# Patient Record
Sex: Female | Born: 1990 | Hispanic: Yes | Marital: Married | Smoking: Never smoker
Health system: Southern US, Community
[De-identification: ages and names within clinical notes are randomized; demographics above are authoritative.]

## PROBLEM LIST (undated history)

## (undated) DIAGNOSIS — Z789 Other specified health status: Secondary | ICD-10-CM

## (undated) HISTORY — PX: NO PAST SURGERIES: SHX2092

## (undated) HISTORY — DX: Other specified health status: Z78.9

---

## 2017-09-23 NOTE — L&D Delivery Note (Signed)
  Maud, Rubendall [[id number]]  Delivery Note At 10:48 PM a viable female was delivered via Vaginal, Spontaneous (Presentation: vertex ; LOA ).  APGAR:8, 9 ; weight 4lb 12.5 oz .   Placenta status: spontaneous ,intact .  Cord: 3 vessel  with the following complications: none   Anesthesia:  Epidural Episiotomy:  no Lacerations:  none Suture Repair: n/a Est. Blood Loss (mL):   Mom to postpartum.  Baby to Couplet care / Skin to Skin.  Felisa Bonier, MD, PGY-1 Family Medicine- [facility] [place] 02/13/2018, 11:06 PM     Janny, Crute [[id number]]  Delivery Note At 10:52 PM a viable female was delivered via Vaginal, Spontaneous (Presentation: vertex; ROA ).  APGAR: 7, 9; weight 4lb 3.2oz  .   Placenta status:spontanoues , intact . Cord: 3 vessel with the following complications: none  Anesthesia:  Epidural Episiotomy:  no Lacerations:  none Suture Repair: n/a Est. Blood Loss (mL):   Mom to postpartum.  Baby to Couplet care / Skin to Skin.  Derenda Fennel, PGY-1 Family Medicine- [facility] [place] 02/13/2018, 11:06 PM  Anaiya Wisinski is a 27 y.o. female G3P2002 with IUP at [redacted]w[redacted]d admitted for SOL .  She progressed without augmentation to complete and pushed less than 10 minutes to deliver baby A.  Baby B's position was confirmed vertex by [place] after delivery of baby A. Patient pushed x1 ctx to deliver baby B. Cord clamped by MD and cut by FOB.  Placenta intact and spontaneous, bleeding minimal.  No lacerations.   Mom stable prior to transfer to postpartum. Due to baby B's size, neonatologist to evaluate if baby will stay in couplet care vs NICU. Mother plans on breast and bottle feeding. She requests Nexplanon for birth control.

## 2017-11-17 LAB — OB RESULTS CONSOLE HIV ANTIBODY (ROUTINE TESTING): HIV: NONREACTIVE

## 2017-11-17 LAB — OB RESULTS CONSOLE RPR: RPR: NONREACTIVE

## 2017-11-17 LAB — OB RESULTS CONSOLE RUBELLA ANTIBODY, IGM: Rubella: IMMUNE

## 2017-11-17 LAB — OB RESULTS CONSOLE HGB/HCT, BLOOD
HCT: 37
HEMOGLOBIN: 12.1

## 2017-11-17 LAB — OB RESULTS CONSOLE GC/CHLAMYDIA
CHLAMYDIA, DNA PROBE: NEGATIVE
Gonorrhea: NEGATIVE

## 2017-11-17 LAB — CYSTIC FIBROSIS DIAGNOSTIC STUDY: Interpretation-CFDNA:: NEGATIVE

## 2017-11-17 LAB — CULTURE, OB URINE: Urine Culture, OB: NEGATIVE

## 2017-11-17 LAB — OB RESULTS CONSOLE PLATELET COUNT: PLATELETS: 325

## 2017-11-17 LAB — OB RESULTS CONSOLE ABO/RH: RH Type: POSITIVE

## 2017-11-17 LAB — OB RESULTS CONSOLE VARICELLA ZOSTER ANTIBODY, IGG: VARICELLA IGG: NON-IMMUNE/NOT IMMUNE

## 2017-11-17 LAB — OB RESULTS CONSOLE ANTIBODY SCREEN: ANTIBODY SCREEN: NEGATIVE

## 2017-11-17 LAB — CYTOLOGY - PAP: PAP SMEAR: NEGATIVE

## 2017-11-17 LAB — OB RESULTS CONSOLE HEPATITIS B SURFACE ANTIGEN: Hepatitis B Surface Ag: NEGATIVE

## 2017-11-17 LAB — GLUCOSE, 1 HOUR: Glucose 1 Hour: 74

## 2017-12-05 ENCOUNTER — Encounter: Payer: Self-pay | Admitting: *Deleted

## 2017-12-08 ENCOUNTER — Encounter: Payer: Self-pay | Admitting: Obstetrics and Gynecology

## 2017-12-08 ENCOUNTER — Ambulatory Visit (INDEPENDENT_AMBULATORY_CARE_PROVIDER_SITE_OTHER): Payer: Self-pay | Admitting: Obstetrics and Gynecology

## 2017-12-08 VITALS — BP 103/65 | HR 72 | Wt 160.8 lb

## 2017-12-08 DIAGNOSIS — O0990 Supervision of high risk pregnancy, unspecified, unspecified trimester: Secondary | ICD-10-CM

## 2017-12-08 DIAGNOSIS — Z603 Acculturation difficulty: Secondary | ICD-10-CM | POA: Insufficient documentation

## 2017-12-08 DIAGNOSIS — O30032 Twin pregnancy, monochorionic/diamniotic, second trimester: Secondary | ICD-10-CM

## 2017-12-08 DIAGNOSIS — O0992 Supervision of high risk pregnancy, unspecified, second trimester: Secondary | ICD-10-CM

## 2017-12-08 DIAGNOSIS — O30039 Twin pregnancy, monochorionic/diamniotic, unspecified trimester: Secondary | ICD-10-CM | POA: Insufficient documentation

## 2017-12-08 DIAGNOSIS — Z789 Other specified health status: Secondary | ICD-10-CM

## 2017-12-08 NOTE — Progress Notes (Signed)
   PRENATAL VISIT NOTE - [facility]  Subjective:  Angel Swanson is a 27 y.o. G3P2002 at 2864w6d being seen today for ongoing prenatal care.  She is currently monitored for the following issues for this high-risk pregnancy and has Supervision of high risk pregnancy, antepartum; Monochorionic diamniotic twin gestation; and Language barrier on their problem list.  Patient reports no complaints.  Contractions: Not present. Vag. Bleeding: None.  Movement: Present. Denies leaking of fluid.   The following portions of the patient's history were reviewed and updated as appropriate: allergies, current medications, past family history, past medical history, past social history, past surgical history and problem list. Problem list updated.  Objective:   Vitals:   12/08/17 1545  BP: 103/65  Pulse: 72  Weight: 160 lb 12.8 oz (72.9 kg)   Fetal Status: Fetal Heart Rate (bpm): 151/146   Movement: Present     General:  Alert, oriented and cooperative. Patient is in no acute distress.  Skin: Skin is warm and dry. No rash noted.   Cardiovascular: Normal heart rate noted  Respiratory: Normal respiratory effort, no problems with respiration noted  Abdomen: Soft, gravid, appropriate for gestational age.  Pain/Pressure: Absent     Pelvic: Cervical exam deferred        Extremities: Normal range of motion.  Edema: None  Mental Status:  Normal mood and affect. Normal behavior. Normal judgment and thought content.   Assessment and Plan:  Pregnancy: G3P2002 at 5764w6d  1. Supervision of high risk pregnancy, antepartum No issues currently  2. Monochorionic diamniotic twin gestation in second trimester Reviewed mono-di twins and need for increased surveillance, patient verbalizes understanding - US MFM OB DETAIL +14 WK; Future - US MFM OB DETAIL ADDL GEST +14 WK; Future  3. Language barrier Spanish speaking    Preterm labor symptoms and general obstetric precautions including but  not limited to vaginal bleeding, contractions, leaking of fluid and fetal movement were reviewed in detail with the patient. Please refer to After Visit Summary for other counseling recommendations.  Return in about 2 weeks (around 12/22/2017) for OB visit (MD), 2 hr GTT, 3rd trim labs.   Conan BowensKelly M Dahlton Hinde, MD

## 2017-12-12 ENCOUNTER — Encounter (HOSPITAL_COMMUNITY): Payer: Self-pay

## 2017-12-12 ENCOUNTER — Ambulatory Visit (HOSPITAL_COMMUNITY)
Admission: RE | Admit: 2017-12-12 | Discharge: 2017-12-12 | Disposition: A | Discharge status: Home / Self Care | Payer: Self-pay | Source: Ambulatory Visit | Attending: Obstetrics and Gynecology | Admitting: Obstetrics and Gynecology

## 2017-12-12 DIAGNOSIS — Z3A26 26 weeks gestation of pregnancy: Secondary | ICD-10-CM | POA: Insufficient documentation

## 2017-12-12 DIAGNOSIS — O30032 Twin pregnancy, monochorionic/diamniotic, second trimester: Secondary | ICD-10-CM | POA: Insufficient documentation

## 2017-12-12 DIAGNOSIS — Z363 Encounter for antenatal screening for malformations: Secondary | ICD-10-CM | POA: Insufficient documentation

## 2017-12-12 DIAGNOSIS — Z789 Other specified health status: Secondary | ICD-10-CM

## 2017-12-15 ENCOUNTER — Other Ambulatory Visit (HOSPITAL_COMMUNITY): Payer: Self-pay | Admitting: *Deleted

## 2017-12-15 DIAGNOSIS — O30033 Twin pregnancy, monochorionic/diamniotic, third trimester: Secondary | ICD-10-CM

## 2017-12-26 ENCOUNTER — Ambulatory Visit (HOSPITAL_COMMUNITY)
Admission: RE | Admit: 2017-12-26 | Discharge: 2017-12-26 | Disposition: A | Discharge status: Home / Self Care | Payer: Self-pay | Source: Ambulatory Visit | Attending: Obstetrics and Gynecology | Admitting: Obstetrics and Gynecology

## 2017-12-26 ENCOUNTER — Encounter (HOSPITAL_COMMUNITY): Payer: Self-pay | Admitting: Obstetrics and Gynecology

## 2017-12-26 ENCOUNTER — Other Ambulatory Visit: Payer: Self-pay

## 2017-12-26 ENCOUNTER — Ambulatory Visit (INDEPENDENT_AMBULATORY_CARE_PROVIDER_SITE_OTHER): Payer: Self-pay | Admitting: Obstetrics and Gynecology

## 2017-12-26 ENCOUNTER — Other Ambulatory Visit (HOSPITAL_COMMUNITY): Payer: Self-pay | Admitting: Obstetrics and Gynecology

## 2017-12-26 ENCOUNTER — Ambulatory Visit (HOSPITAL_COMMUNITY): Admission: RE | Admit: 2017-12-26 | Payer: Self-pay | Source: Ambulatory Visit

## 2017-12-26 ENCOUNTER — Encounter: Payer: Self-pay | Admitting: Obstetrics and Gynecology

## 2017-12-26 ENCOUNTER — Encounter (HOSPITAL_COMMUNITY): Payer: Self-pay

## 2017-12-26 VITALS — BP 106/71 | HR 96 | Wt 162.3 lb

## 2017-12-26 DIAGNOSIS — Z362 Encounter for other antenatal screening follow-up: Secondary | ICD-10-CM

## 2017-12-26 DIAGNOSIS — Z3A28 28 weeks gestation of pregnancy: Secondary | ICD-10-CM

## 2017-12-26 DIAGNOSIS — O30032 Twin pregnancy, monochorionic/diamniotic, second trimester: Secondary | ICD-10-CM | POA: Insufficient documentation

## 2017-12-26 DIAGNOSIS — O30033 Twin pregnancy, monochorionic/diamniotic, third trimester: Secondary | ICD-10-CM

## 2017-12-26 DIAGNOSIS — O0993 Supervision of high risk pregnancy, unspecified, third trimester: Secondary | ICD-10-CM

## 2017-12-26 DIAGNOSIS — Z3A29 29 weeks gestation of pregnancy: Secondary | ICD-10-CM | POA: Insufficient documentation

## 2017-12-26 DIAGNOSIS — O0990 Supervision of high risk pregnancy, unspecified, unspecified trimester: Secondary | ICD-10-CM

## 2017-12-26 DIAGNOSIS — Z23 Encounter for immunization: Secondary | ICD-10-CM

## 2017-12-26 NOTE — Progress Notes (Signed)
.     PRENATAL VISIT NOTE  Subjective:  Angel Swanson is a [age] y.o. G3P2002 at 2833w6d being seen today for ongoing prenatal care.  She is currently monitored for the following issues for this high-risk pregnancy and has Supervision of high risk pregnancy, antepartum; Monochorionic diamniotic twin gestation; and Language barrier on their problem list.  Patient reports no complaints.  Contractions: Not present. Vag. Bleeding: None.  Movement: Present. Denies leaking of fluid.   The following portions of the patient's history were reviewed and updated as appropriate: allergies, current medications, past family history, past medical history, past social history, past surgical history and problem list. Problem list updated.  Objective:   Vitals:   12/26/17 0956  BP: 106/71  Pulse: 96  Weight: 162 lb 4.8 oz (73.6 kg)    Fetal Status: Fetal Heart Rate (bpm): 138/150   Movement: Present     General:  Alert, oriented and cooperative. Patient is in no acute distress.  Skin: Skin is warm and dry. No rash noted.   Cardiovascular: Normal heart rate noted  Respiratory: Normal respiratory effort, no problems with respiration noted  Abdomen: Soft, gravid, appropriate for gestational age.  Pain/Pressure: Absent     Pelvic: Cervical exam deferred        Extremities: Normal range of motion.  Edema: None  Mental Status: Normal mood and affect. Normal behavior. Normal judgment and thought content.   Assessment and Plan:  Pregnancy: G3P2002 at 2833w6d  1. Supervision of high risk pregnancy, antepartum Patient is doing well without complaints Third trimester labs today with tdap Patient remains undecided on contraception and pediatrician - Tdap vaccine greater than or equal to 7yo IM  2. Monochorionic diamniotic twin gestation in third trimester Patient has scheduled follow up ultrasound  today  Preterm labor symptoms and general obstetric precautions including but not limited to vaginal  bleeding, contractions, leaking of fluid and fetal movement were reviewed in detail with the patient. Please refer to After Visit Summary for other counseling recommendations.  No follow-ups on file.  Future Appointments  Date Time Provider Department Center  01/09/2018  9:30 AM [facility] US 1 [facility]    Catalina AntiguaPeggy Uliana Brinker, MD

## 2017-12-27 LAB — CBC
Hematocrit: 35.5 % (ref 34.0–46.6)
Hemoglobin: 12.3 g/dL (ref 11.1–15.9)
MCH: 29.1 pg (ref 26.6–33.0)
MCHC: 34.6 g/dL (ref 31.5–35.7)
MCV: 84 fL (ref 79–97)
Platelets: 307 10*3/uL (ref 150–379)
RBC: 4.22 x10E6/uL (ref 3.77–5.28)
RDW: 13 % (ref 12.3–15.4)
WBC: 10.4 10*3/uL (ref 3.4–10.8)

## 2017-12-27 LAB — GLUCOSE TOLERANCE, 2 HOURS W/ 1HR
GLUCOSE, 1 HOUR: 97 mg/dL (ref 65–179)
GLUCOSE, 2 HOUR: 104 mg/dL (ref 65–152)
GLUCOSE, FASTING: 76 mg/dL (ref 65–91)

## 2017-12-27 LAB — RPR: RPR Ser Ql: NONREACTIVE

## 2017-12-27 LAB — HIV ANTIBODY (ROUTINE TESTING W REFLEX): HIV Screen 4th Generation wRfx: NONREACTIVE

## 2018-01-09 ENCOUNTER — Ambulatory Visit (HOSPITAL_COMMUNITY)
Admission: RE | Admit: 2018-01-09 | Discharge: 2018-01-09 | Disposition: A | Discharge status: Home / Self Care | Payer: Self-pay | Source: Ambulatory Visit | Attending: Obstetrics and Gynecology | Admitting: Obstetrics and Gynecology

## 2018-01-09 ENCOUNTER — Encounter (HOSPITAL_COMMUNITY): Payer: Self-pay

## 2018-01-09 ENCOUNTER — Other Ambulatory Visit (HOSPITAL_COMMUNITY): Payer: Self-pay | Admitting: *Deleted

## 2018-01-09 DIAGNOSIS — Z362 Encounter for other antenatal screening follow-up: Secondary | ICD-10-CM | POA: Insufficient documentation

## 2018-01-09 DIAGNOSIS — O30033 Twin pregnancy, monochorionic/diamniotic, third trimester: Secondary | ICD-10-CM

## 2018-01-09 DIAGNOSIS — O30032 Twin pregnancy, monochorionic/diamniotic, second trimester: Secondary | ICD-10-CM | POA: Insufficient documentation

## 2018-01-09 DIAGNOSIS — Z3A31 31 weeks gestation of pregnancy: Secondary | ICD-10-CM | POA: Insufficient documentation

## 2018-01-09 NOTE — ED Notes (Signed)
Interpreter [phone] used for visit.

## 2018-01-13 ENCOUNTER — Ambulatory Visit (HOSPITAL_COMMUNITY)
Admission: RE | Admit: 2018-01-13 | Discharge: 2018-01-13 | Disposition: A | Discharge status: Home / Self Care | Payer: Self-pay | Source: Ambulatory Visit | Attending: Obstetrics and Gynecology | Admitting: Obstetrics and Gynecology

## 2018-01-13 ENCOUNTER — Encounter (HOSPITAL_COMMUNITY): Payer: Self-pay

## 2018-01-13 DIAGNOSIS — O30033 Twin pregnancy, monochorionic/diamniotic, third trimester: Secondary | ICD-10-CM | POA: Insufficient documentation

## 2018-01-13 DIAGNOSIS — Z3A32 32 weeks gestation of pregnancy: Secondary | ICD-10-CM | POA: Insufficient documentation

## 2018-01-20 ENCOUNTER — Other Ambulatory Visit (HOSPITAL_COMMUNITY): Payer: Self-pay | Admitting: Obstetrics and Gynecology

## 2018-01-20 ENCOUNTER — Ambulatory Visit (HOSPITAL_COMMUNITY)
Admission: RE | Admit: 2018-01-20 | Discharge: 2018-01-20 | Disposition: A | Discharge status: Home / Self Care | Payer: Self-pay | Source: Ambulatory Visit | Attending: Obstetrics and Gynecology | Admitting: Obstetrics and Gynecology

## 2018-01-20 ENCOUNTER — Encounter (HOSPITAL_COMMUNITY): Payer: Self-pay

## 2018-01-20 DIAGNOSIS — O30033 Twin pregnancy, monochorionic/diamniotic, third trimester: Secondary | ICD-10-CM

## 2018-01-20 DIAGNOSIS — Z3A33 33 weeks gestation of pregnancy: Secondary | ICD-10-CM

## 2018-01-20 DIAGNOSIS — O30032 Twin pregnancy, monochorionic/diamniotic, second trimester: Secondary | ICD-10-CM | POA: Insufficient documentation

## 2018-01-20 NOTE — Addendum Note (Signed)
Encounter addended by: Lenise Arena, RDMS on: 01/20/2018 9:54 AM  Actions taken: Imaging Exam ended

## 2018-01-27 ENCOUNTER — Ambulatory Visit (HOSPITAL_COMMUNITY)
Admission: RE | Admit: 2018-01-27 | Discharge: 2018-01-27 | Disposition: A | Discharge status: Home / Self Care | Payer: Self-pay | Source: Ambulatory Visit | Attending: Obstetrics and Gynecology | Admitting: Obstetrics and Gynecology

## 2018-02-03 ENCOUNTER — Ambulatory Visit (HOSPITAL_COMMUNITY): Payer: Self-pay

## 2018-02-04 ENCOUNTER — Encounter (HOSPITAL_COMMUNITY): Payer: Self-pay

## 2018-02-04 ENCOUNTER — Other Ambulatory Visit (HOSPITAL_COMMUNITY): Payer: Self-pay | Admitting: Obstetrics and Gynecology

## 2018-02-04 ENCOUNTER — Ambulatory Visit (HOSPITAL_COMMUNITY)
Admission: RE | Admit: 2018-02-04 | Discharge: 2018-02-04 | Disposition: A | Discharge status: Home / Self Care | Payer: Self-pay | Source: Ambulatory Visit | Attending: Obstetrics and Gynecology | Admitting: Obstetrics and Gynecology

## 2018-02-04 ENCOUNTER — Inpatient Hospital Stay (HOSPITAL_COMMUNITY)
Admission: AD | Admit: 2018-02-04 | Discharge: 2018-02-04 | Disposition: A | Discharge status: Home / Self Care | Payer: Self-pay | Source: Ambulatory Visit | Attending: Obstetrics and Gynecology | Admitting: Obstetrics and Gynecology

## 2018-02-04 ENCOUNTER — Encounter (HOSPITAL_COMMUNITY): Payer: Self-pay | Admitting: *Deleted

## 2018-02-04 DIAGNOSIS — Z3A35 35 weeks gestation of pregnancy: Secondary | ICD-10-CM | POA: Insufficient documentation

## 2018-02-04 DIAGNOSIS — Z789 Other specified health status: Secondary | ICD-10-CM

## 2018-02-04 DIAGNOSIS — Z362 Encounter for other antenatal screening follow-up: Secondary | ICD-10-CM

## 2018-02-04 DIAGNOSIS — O30033 Twin pregnancy, monochorionic/diamniotic, third trimester: Secondary | ICD-10-CM | POA: Insufficient documentation

## 2018-02-04 DIAGNOSIS — O365932 Maternal care for other known or suspected poor fetal growth, third trimester, fetus 2: Secondary | ICD-10-CM

## 2018-02-04 DIAGNOSIS — Z79899 Other long term (current) drug therapy: Secondary | ICD-10-CM | POA: Insufficient documentation

## 2018-02-04 DIAGNOSIS — O368330 Maternal care for abnormalities of the fetal heart rate or rhythm, third trimester, not applicable or unspecified: Secondary | ICD-10-CM

## 2018-02-04 DIAGNOSIS — Z3689 Encounter for other specified antenatal screening: Secondary | ICD-10-CM

## 2018-02-04 DIAGNOSIS — O365990 Maternal care for other known or suspected poor fetal growth, unspecified trimester, not applicable or unspecified: Secondary | ICD-10-CM | POA: Insufficient documentation

## 2018-02-04 NOTE — MAU Provider Note (Signed)
  History     CSN: [id number]  Arrival date and time: 02/04/18 1626   First Provider Initiated Contact with Patient 02/04/18 1754      Chief Complaint  Patient presents with  . BPP 6/8   Hennessy Bartel is a 27 y.o. G3P2002 at [redacted]w[redacted]d who presents today from MFM for fetal monitoring. Baby B had 6/8 BPP and baby A had 8/8 BPP. MFM wanted FHR monitoring. Patient denies any VB or LOF. She reports normal fetal movement.   Past Medical History:  Diagnosis Date  . Medical history non-contributory     Past Surgical History:  Procedure Laterality Date  . NO PAST SURGERIES      History reviewed. No pertinent family history.  Social History   Tobacco Use  . Smoking status: Never Smoker  . Smokeless tobacco: Never Used  Substance Use Topics  . Alcohol use: No    Frequency: Never  . Drug use: No    Allergies: No Known Allergies  Medications Prior to Admission  Medication Sig Dispense Refill Last Dose  . prenatal vitamin w/FE, FA (PRENATAL 1 + 1) 27-1 MG TABS tablet Take 1 tablet by mouth daily at 12 noon.   02/03/2018 at Unknown time    Review of Systems  Constitutional: Negative for chills and fever.  Gastrointestinal: Negative for nausea and vomiting.  Genitourinary: Negative for dysuria, vaginal bleeding and vaginal discharge.   Physical Exam   Blood pressure 115/67, pulse 66, last menstrual period 05/31/2017, SpO2 100 %.  Physical Exam  Nursing note and vitals reviewed. Constitutional: She is oriented to person, place, and time. She appears well-developed and well-nourished. No distress.  HENT:  Head: Normocephalic.  Cardiovascular: Normal rate.  Respiratory: Effort normal.  GI: Soft. There is no tenderness.  Neurological: She is alert and oriented to person, place, and time.  Skin: Skin is warm and dry.  Psychiatric: She has a normal mood and affect.    MAU Course  Procedures  MDM 1755: DW Dr. Emelda Fear and reviewed FHR tracing. Baby B now 8/10 BPP  with reactive NST and baby A 10/10 with reactive NST. OK for DC home.   Assessment and Plan   1. Monochorionic diamniotic twin gestation in third trimester   2. Language barrier   3. [redacted] weeks gestation of pregnancy   4. NST (non-stress test) reactive    DC home Comfort measures reviewed  3rd Trimester precautions  PTL precautions  Fetal kick counts RX: none  Return to MAU as needed FU with OB as planned  Follow-up Information    Center for [facility] Follow up.   Specialty:  Obstetrics and Gynecology Contact information: [address] [place] [address] [phone]           Thressa Sheller 02/04/2018, 5:54 PM

## 2018-02-04 NOTE — MAU Note (Signed)
Sent over from MFM, twin gestation, BPP on baby B was 6/8- sent for prolonged monitoring.  Pt verbalized understanding.

## 2018-02-04 NOTE — Discharge Instructions (Signed)

## 2018-02-04 NOTE — ED Notes (Signed)
Dr. Ezzard Standing requests that patient go to MAU for further EFM due to Baby B 6/8 on BPP (- breathing movements). Bonnye Fava present as interpreter during discussion with patient. Report called to J. Spurlock-Frizzell, RN, CN and H. Mathews Robinsons, CNM. Patient escorted to MAU and assisted with sign-in.

## 2018-02-04 NOTE — Addendum Note (Signed)
Encounter addended by: Arvil Persons, RN on: 02/04/2018 4:33 PM  Actions taken: Sign clinical note

## 2018-02-10 ENCOUNTER — Other Ambulatory Visit: Payer: Self-pay

## 2018-02-10 ENCOUNTER — Other Ambulatory Visit (HOSPITAL_COMMUNITY): Payer: Self-pay | Admitting: Obstetrics and Gynecology

## 2018-02-10 ENCOUNTER — Encounter (HOSPITAL_COMMUNITY): Payer: Self-pay

## 2018-02-10 ENCOUNTER — Ambulatory Visit (HOSPITAL_COMMUNITY)
Admission: RE | Admit: 2018-02-10 | Discharge: 2018-02-10 | Disposition: A | Discharge status: Home / Self Care | Payer: Self-pay | Source: Ambulatory Visit | Attending: Obstetrics and Gynecology | Admitting: Obstetrics and Gynecology

## 2018-02-10 ENCOUNTER — Ambulatory Visit (INDEPENDENT_AMBULATORY_CARE_PROVIDER_SITE_OTHER): Payer: Self-pay | Admitting: Obstetrics & Gynecology

## 2018-02-10 VITALS — BP 136/78 | HR 67 | Wt 168.0 lb

## 2018-02-10 DIAGNOSIS — O365932 Maternal care for other known or suspected poor fetal growth, third trimester, fetus 2: Secondary | ICD-10-CM | POA: Insufficient documentation

## 2018-02-10 DIAGNOSIS — Z3A36 36 weeks gestation of pregnancy: Secondary | ICD-10-CM | POA: Insufficient documentation

## 2018-02-10 DIAGNOSIS — Z113 Encounter for screening for infections with a predominantly sexual mode of transmission: Secondary | ICD-10-CM

## 2018-02-10 DIAGNOSIS — O30033 Twin pregnancy, monochorionic/diamniotic, third trimester: Secondary | ICD-10-CM

## 2018-02-10 DIAGNOSIS — O0993 Supervision of high risk pregnancy, unspecified, third trimester: Secondary | ICD-10-CM

## 2018-02-10 DIAGNOSIS — O365931 Maternal care for other known or suspected poor fetal growth, third trimester, fetus 1: Secondary | ICD-10-CM | POA: Insufficient documentation

## 2018-02-10 DIAGNOSIS — O0990 Supervision of high risk pregnancy, unspecified, unspecified trimester: Secondary | ICD-10-CM

## 2018-02-10 DIAGNOSIS — Z789 Other specified health status: Secondary | ICD-10-CM

## 2018-02-10 NOTE — Progress Notes (Signed)
   PRENATAL VISIT NOTE  Subjective:  Angel Swanson is a 27 y.o. G3P2002 at [redacted]w[redacted]d being seen today for ongoing prenatal care.  She is currently monitored for the following issues for this high-risk pregnancy and has Supervision of high risk pregnancy, antepartum; Monochorionic diamniotic twin gestation; and Language barrier on their problem list.  Patient reports no complaints.  Contractions: Irregular. Vag. Bleeding: None.  Movement: Present. Denies leaking of fluid.   The following portions of the patient's history were reviewed and updated as appropriate: allergies, current medications, past family history, past medical history, past social history, past surgical history and problem list. Problem list updated.  Objective:   Vitals:   02/10/18 1024  BP: 136/78  Pulse: 67  Weight: 168 lb (76.2 kg)    Fetal Status:     Movement: Present     General:  Alert, oriented and cooperative. Patient is in no acute distress.  Skin: Skin is warm and dry. No rash noted.   Cardiovascular: Normal heart rate noted  Respiratory: Normal respiratory effort, no problems with respiration noted  Abdomen: Soft, gravid, appropriate for gestational age.  Pain/Pressure: Present     Pelvic: Cervical exam performed        Extremities: Normal range of motion.  Edema: None  Mental Status: Normal mood and affect. Normal behavior. Normal judgment and thought content.   Assessment and Plan:  Pregnancy: G3P2002 at [redacted]w[redacted]d  1. Supervision of high risk pregnancy, antepartum  - Cervicovaginal ancillary only - Culture, beta strep (group b only)  2. Monochorionic diamniotic twin gestation in third trimester - iol at 37 weeks per MFM  3. Language barrier Live interpretor present  Preterm labor symptoms and general obstetric precautions including but not limited to vaginal bleeding, contractions, leaking of fluid and fetal movement were reviewed in detail with the patient. Please refer to After Visit  Summary for other counseling recommendations.  Return in about 1 month (around 03/10/2018).  Future Appointments  Date Time Provider Department Center  02/10/2018 10:55 AM Allie Bossier, MD [facility]    Allie Bossier, MD

## 2018-02-10 NOTE — Progress Notes (Signed)
See order(s).

## 2018-02-11 ENCOUNTER — Telehealth (HOSPITAL_COMMUNITY): Payer: Self-pay | Admitting: *Deleted

## 2018-02-11 LAB — CERVICOVAGINAL ANCILLARY ONLY
CHLAMYDIA, DNA PROBE: NEGATIVE
Neisseria Gonorrhea: NEGATIVE

## 2018-02-11 NOTE — Telephone Encounter (Signed)
[phone] interpreter number  Preadmission screen

## 2018-02-13 ENCOUNTER — Inpatient Hospital Stay (HOSPITAL_COMMUNITY): Payer: Medicaid Other | Admitting: Anesthesiology

## 2018-02-13 ENCOUNTER — Inpatient Hospital Stay (HOSPITAL_COMMUNITY)
Admission: AD | Admit: 2018-02-13 | Discharge: 2018-02-15 | DRG: 805 | Disposition: A | Discharge status: Home / Self Care | Payer: Medicaid Other | Source: Ambulatory Visit | Attending: Obstetrics & Gynecology | Admitting: Obstetrics & Gynecology

## 2018-02-13 ENCOUNTER — Encounter (HOSPITAL_COMMUNITY): Payer: Self-pay | Admitting: Emergency Medicine

## 2018-02-13 DIAGNOSIS — O30039 Twin pregnancy, monochorionic/diamniotic, unspecified trimester: Secondary | ICD-10-CM | POA: Diagnosis present

## 2018-02-13 DIAGNOSIS — Z789 Other specified health status: Secondary | ICD-10-CM

## 2018-02-13 DIAGNOSIS — O30033 Twin pregnancy, monochorionic/diamniotic, third trimester: Principal | ICD-10-CM | POA: Diagnosis present

## 2018-02-13 DIAGNOSIS — Z3A36 36 weeks gestation of pregnancy: Secondary | ICD-10-CM

## 2018-02-13 DIAGNOSIS — O365932 Maternal care for other known or suspected poor fetal growth, third trimester, fetus 2: Secondary | ICD-10-CM | POA: Diagnosis present

## 2018-02-13 DIAGNOSIS — Z758 Other problems related to medical facilities and other health care: Secondary | ICD-10-CM | POA: Diagnosis present

## 2018-02-13 DIAGNOSIS — O365931 Maternal care for other known or suspected poor fetal growth, third trimester, fetus 1: Secondary | ICD-10-CM | POA: Diagnosis present

## 2018-02-13 LAB — CBC
HCT: 44.5 % (ref 36.0–46.0)
Hemoglobin: 14.6 g/dL (ref 12.0–15.0)
MCH: 28.5 pg (ref 26.0–34.0)
MCHC: 32.8 g/dL (ref 30.0–36.0)
MCV: 86.9 fL (ref 78.0–100.0)
PLATELETS: 281 10*3/uL (ref 150–400)
RBC: 5.12 MIL/uL — AB (ref 3.87–5.11)
RDW: 14.2 % (ref 11.5–15.5)
WBC: 17.8 10*3/uL — ABNORMAL HIGH (ref 4.0–10.5)

## 2018-02-13 LAB — TYPE AND SCREEN
ABO/RH(D): O POS
Antibody Screen: NEGATIVE

## 2018-02-13 MED ORDER — EPHEDRINE 5 MG/ML INJ
10.0000 mg | INTRAVENOUS | Status: DC | PRN
  Filled 2018-02-13: qty 2

## 2018-02-13 MED ORDER — OXYCODONE-ACETAMINOPHEN 5-325 MG PO TABS: 2.0000 | ORAL_TABLET | ORAL | Status: DC | PRN

## 2018-02-13 MED ORDER — FENTANYL 2.5 MCG/ML BUPIVACAINE 1/10 % EPIDURAL INFUSION (WH - ANES)
14.0000 mL/h | INTRAMUSCULAR | Status: DC | PRN
  Administered 2018-02-13: 14 mL/h via EPIDURAL
  Filled 2018-02-13: qty 100

## 2018-02-13 MED ORDER — SOD CITRATE-CITRIC ACID 500-334 MG/5ML PO SOLN: 30.0000 mL | ORAL | Status: DC | PRN

## 2018-02-13 MED ORDER — LIDOCAINE HCL (PF) 1 % IJ SOLN
30.0000 mL | INTRAMUSCULAR | Status: DC | PRN
  Filled 2018-02-13: qty 30

## 2018-02-13 MED ORDER — PHENYLEPHRINE 40 MCG/ML (10ML) SYRINGE FOR IV PUSH (FOR BLOOD PRESSURE SUPPORT)
80.0000 ug | PREFILLED_SYRINGE | INTRAVENOUS | Status: DC | PRN
  Filled 2018-02-13: qty 5

## 2018-02-13 MED ORDER — OXYCODONE-ACETAMINOPHEN 5-325 MG PO TABS
1.0000 | ORAL_TABLET | ORAL | Status: DC | PRN
Start: 2018-02-13 — End: 2018-02-14

## 2018-02-13 MED ORDER — PHENYLEPHRINE 40 MCG/ML (10ML) SYRINGE FOR IV PUSH (FOR BLOOD PRESSURE SUPPORT)
80.0000 ug | PREFILLED_SYRINGE | INTRAVENOUS | Status: DC | PRN
  Filled 2018-02-13: qty 10
  Filled 2018-02-13: qty 5

## 2018-02-13 MED ORDER — FENTANYL CITRATE (PF) 100 MCG/2ML IJ SOLN
100.0000 ug | INTRAMUSCULAR | Status: DC | PRN
  Administered 2018-02-13: 100 ug via INTRAVENOUS
  Filled 2018-02-13: qty 2

## 2018-02-13 MED ORDER — OXYTOCIN 40 UNITS IN LACTATED RINGERS INFUSION - SIMPLE MED
2.5000 [IU]/h | INTRAVENOUS | Status: DC
  Administered 2018-02-13: 2.5 [IU]/h via INTRAVENOUS
  Filled 2018-02-13 (×2): qty 1000

## 2018-02-13 MED ORDER — ONDANSETRON HCL 4 MG/2ML IJ SOLN: 4.0000 mg | Freq: Four times a day (QID) | INTRAMUSCULAR | Status: DC | PRN

## 2018-02-13 MED ORDER — LIDOCAINE HCL (PF) 1 % IJ SOLN
INTRAMUSCULAR | Status: DC | PRN
  Administered 2018-02-13: 5 mL via EPIDURAL
  Administered 2018-02-13: 7 mL via EPIDURAL

## 2018-02-13 MED ORDER — LACTATED RINGERS IV SOLN: INTRAVENOUS | Status: DC

## 2018-02-13 MED ORDER — TERBUTALINE SULFATE 1 MG/ML IJ SOLN
0.2500 mg | Freq: Once | INTRAMUSCULAR | Status: DC | PRN
  Filled 2018-02-13: qty 1

## 2018-02-13 MED ORDER — LACTATED RINGERS IV SOLN
INTRAVENOUS | Status: DC
  Administered 2018-02-13: 22:00:00 via INTRAVENOUS

## 2018-02-13 MED ORDER — ACETAMINOPHEN 325 MG PO TABS: 650.0000 mg | ORAL_TABLET | ORAL | Status: DC | PRN

## 2018-02-13 MED ORDER — LACTATED RINGERS IV SOLN: 500.0000 mL | INTRAVENOUS | Status: DC | PRN

## 2018-02-13 MED ORDER — DIPHENHYDRAMINE HCL 50 MG/ML IJ SOLN: 12.5000 mg | INTRAMUSCULAR | Status: DC | PRN

## 2018-02-13 MED ORDER — LACTATED RINGERS IV SOLN
500.0000 mL | Freq: Once | INTRAVENOUS | Status: AC
  Administered 2018-02-13: 500 mL via INTRAVENOUS

## 2018-02-13 MED ORDER — OXYTOCIN BOLUS FROM INFUSION
500.0000 mL | Freq: Once | INTRAVENOUS | Status: AC
  Administered 2018-02-13: 500 mL via INTRAVENOUS

## 2018-02-13 NOTE — Progress Notes (Signed)
L Leftwich-Kirby CNM in Triage to see pt. Aware going to 166 and will see pt in BS

## 2018-02-13 NOTE — Anesthesia Preprocedure Evaluation (Signed)
Anesthesia Evaluation  Patient identified by MRN, date of birth, ID band Patient awake    Reviewed: Allergy & Precautions, H&P , NPO status , Patient's Chart, lab work & pertinent test results  Airway Mallampati: II  TM Distance: >3 FB Neck ROM: full    Dental no notable dental hx.    Pulmonary neg pulmonary ROS,    Pulmonary exam normal breath sounds clear to auscultation       Cardiovascular negative cardio ROS Normal cardiovascular exam Rhythm:regular Rate:Normal     Neuro/Psych negative neurological ROS  negative psych ROS   GI/Hepatic negative GI ROS, Neg liver ROS,   Endo/Other  negative endocrine ROS  Renal/GU negative Renal ROS  negative genitourinary   Musculoskeletal negative musculoskeletal ROS (+)   Abdominal (+) + obese,   Peds  Hematology negative hematology ROS (+)   Anesthesia Other Findings   Reproductive/Obstetrics (+) Pregnancy                             Anesthesia Physical Anesthesia Plan  ASA: II  Anesthesia Plan: Epidural   Post-op Pain Management:    Induction:   PONV Risk Score and Plan:   Airway Management Planned:   Additional Equipment:   Intra-op Plan:   Post-operative Plan:   Informed Consent: I have reviewed the patients History and Physical, chart, labs and discussed the procedure including the risks, benefits and alternatives for the proposed anesthesia with the patient or authorized representative who has indicated his/her understanding and acceptance.     Plan Discussed with:   Anesthesia Plan Comments:         Anesthesia Quick Evaluation  

## 2018-02-13 NOTE — Progress Notes (Signed)
Pt to BS via w/c from Triage. For MN induction and report called to Sandrea Hughs RNC in [facility].

## 2018-02-13 NOTE — Anesthesia Procedure Notes (Signed)
Epidural Patient location during procedure: OB Start time: 02/13/2018 10:10 PM End time: 02/13/2018 10:21 PM  Staffing Anesthesiologist: Leilani Able, MD Performed: other anesthesia staff and anesthesiologist   Preanesthetic Checklist Completed: patient identified, site marked, surgical consent, pre-op evaluation, timeout performed, IV checked, risks and benefits discussed and monitors and equipment checked  Epidural Patient position: sitting Prep: site prepped and draped and DuraPrep Patient monitoring: continuous pulse ox and blood pressure Approach: midline Location: L3-L4 Injection technique: LOR air  Needle:  Needle type: Tuohy  Needle gauge: 17 G Needle length: 9 cm and 9 Needle insertion depth: 5 cm cm Catheter type: closed end flexible Catheter size: 19 Gauge Catheter at skin depth: 10 cm Test dose: negative and Other  Assessment Events: blood not aspirated, injection not painful, no injection resistance, negative IV test and no paresthesia  Additional Notes Reason for block:procedure for pain

## 2018-02-13 NOTE — MAU Note (Signed)
3.5cm on Tuesday. Ctxs since yesterday and stronger all day. Leaking fld since 12n that is clear. Both boys head down.

## 2018-02-13 NOTE — H&P (Signed)
LABOR AND DELIVERY ADMISSION HISTORY AND PHYSICAL NOTE  Angel Swanson is a 27 y.o. female G3P2002 with IUP at [redacted]w[redacted]d by LMP presenting for SOL.  She reports positive fetal movement. She denies leakage of fluid or vaginal bleeding.  Prenatal History/Complications: PNC at [facility] Pregnancy complications:  - monochorionic diamniotic twin gestation  Past Medical History: Past Medical History:  Diagnosis Date  . Medical history non-contributory     Past Surgical History: Past Surgical History:  Procedure Laterality Date  . NO PAST SURGERIES      Obstetrical History: OB History    Gravida  3   Para  2   Term  2   Preterm  0   AB  0   Living  2     SAB  0   TAB  0   Ectopic  0   Multiple  0   Live Births  2           Social History: Social History   Socioeconomic History  . Marital status: Married    Spouse name: Not on file  . Number of children: Not on file  . Years of education: Not on file  . Highest education level: Not on file  Occupational History  . Not on file  Social Needs  . Financial resource strain: Not on file  . Food insecurity:    Worry: Not on file    Inability: Not on file  . Transportation needs:    Medical: Not on file    Non-medical: Not on file  Tobacco Use  . Smoking status: Never Smoker  . Smokeless tobacco: Never Used  Substance and Sexual Activity  . Alcohol use: No    Frequency: Never  . Drug use: No  . Sexual activity: Yes    Birth control/protection: None  Lifestyle  . Physical activity:    Days per week: Not on file    Minutes per session: Not on file  . Stress: Not on file  Relationships  . Social connections:    Talks on phone: Not on file    Gets together: Not on file    Attends religious service: Not on file    Active member of club or organization: Not on file    Attends meetings of clubs or organizations: Not on file    Relationship status: Not on file  Other Topics Concern  . Not on file   Social History Narrative  . Not on file    Family History: History reviewed. No pertinent family history.  Allergies: No Known Allergies  Medications Prior to Admission  Medication Sig Dispense Refill Last Dose  . prenatal vitamin w/FE, FA (PRENATAL 1 + 1) 27-1 MG TABS tablet Take 1 tablet by mouth daily at 12 noon.   Taking     Review of Systems  All systems reviewed and negative except as stated in HPI  Physical Exam Blood pressure 104/86, pulse 73, temperature 98.9 F (37.2 C), resp. rate 18, height 4' 10.7" (1.491 m), weight 77.1 kg (170 lb), last menstrual period 05/31/2017. General appearance: alert, oriented, NAD Lungs: normal respiratory effort Heart: regular rate Abdomen: soft, non-tender; gravid, FH appropriate for GA Extremities: No calf swelling or tenderness Presentation: cephalic, cephalic Fetal monitoring: baby A  150, accels + , no decels                             Baby B  150 accels +,  no decels Uterine activity: regular, every 2-51m    Prenatal labs: ABO, Rh: O/Positive/-- (02/25 0000) Antibody: Negative (02/25 0000) Rubella: Immune (02/25 0000) RPR: Non Reactive (04/05 0952)  HBsAg: Negative (02/25 0000)  HIV: Non Reactive (04/05 0952)  GC/Chlamydia: negative GBS:   unknown ( pending) 2-hr GTT: 104 Genetic screening:  negative Anatomy US: IUGR ( EFW < 10 % for fetus A & fetus B)  Prenatal Transfer Tool  Maternal Diabetes: No Genetic Screening: Normal Maternal Ultrasounds/Referrals: Abnormal:  Findings:   IUGR baby A & baby B Fetal Ultrasounds or other Referrals:  None Maternal Substance Abuse:  No Significant Maternal Medications:  None Significant Maternal Lab Results: None  Results for orders placed or performed during the hospital encounter of 02/13/18 (from the past 24 hour(s))  CBC   Collection Time: 02/13/18  9:29 PM  Result Value Ref Range   WBC 17.8 (H) 4.0 - 10.5 K/uL   RBC 5.12 (H) 3.87 - 5.11 MIL/uL   Hemoglobin 14.6 12.0 -  15.0 g/dL   HCT 56.3 87.5 - 64.3 %   MCV 86.9 78.0 - 100.0 fL   MCH 28.5 26.0 - 34.0 pg   MCHC 32.8 30.0 - 36.0 g/dL   RDW 32.9 51.8 - 84.1 %   Platelets 281 150 - 400 K/uL    Patient Active Problem List   Diagnosis Date Noted  . Supervision of high risk pregnancy, antepartum 12/08/2017  . Monochorionic diamniotic twin gestation 12/08/2017  . Language barrier 12/08/2017    Assessment: Angel Swanson is a 27 y.o. G3P2002 at [redacted]w[redacted]d here for SOL with contractions  #Labor: Active, progressing normally. Expectant management #Pain: IV pain meds / Epidural #FWB: Baby A cat I            Baby B cat I #ID:  GBS culture pending #MOF: bottle #MOC:Nexplanon #Circ:  no  Felisa Bonier, MD, PGY-1 Family Medicine - [facility] [place] 02/13/2018, 9:48 PM

## 2018-02-14 ENCOUNTER — Inpatient Hospital Stay (HOSPITAL_COMMUNITY): Admission: RE | Admit: 2018-02-14 | Payer: Self-pay | Source: Ambulatory Visit

## 2018-02-14 ENCOUNTER — Encounter (HOSPITAL_COMMUNITY): Payer: Self-pay

## 2018-02-14 ENCOUNTER — Other Ambulatory Visit: Payer: Self-pay

## 2018-02-14 LAB — ABO/RH: ABO/RH(D): O POS

## 2018-02-14 LAB — RPR: RPR Ser Ql: NONREACTIVE

## 2018-02-14 LAB — CULTURE, BETA STREP (GROUP B ONLY): Strep Gp B Culture: NEGATIVE

## 2018-02-14 MED ORDER — ONDANSETRON HCL 4 MG PO TABS: 4.0000 mg | ORAL_TABLET | ORAL | Status: DC | PRN

## 2018-02-14 MED ORDER — DIBUCAINE 1 % RE OINT: 1.0000 "application " | TOPICAL_OINTMENT | RECTAL | Status: DC | PRN

## 2018-02-14 MED ORDER — PRENATAL MULTIVITAMIN CH
1.0000 | ORAL_TABLET | Freq: Every day | ORAL | Status: DC
  Administered 2018-02-14 – 2018-02-15 (×2): 1 via ORAL
  Filled 2018-02-14 (×2): qty 1

## 2018-02-14 MED ORDER — SENNOSIDES-DOCUSATE SODIUM 8.6-50 MG PO TABS
2.0000 | ORAL_TABLET | ORAL | Status: DC
  Administered 2018-02-14: 2 via ORAL
  Filled 2018-02-14: qty 2

## 2018-02-14 MED ORDER — TETANUS-DIPHTH-ACELL PERTUSSIS 5-2.5-18.5 LF-MCG/0.5 IM SUSP: 0.5000 mL | Freq: Once | INTRAMUSCULAR | Status: DC

## 2018-02-14 MED ORDER — BENZOCAINE-MENTHOL 20-0.5 % EX AERO: 1.0000 "application " | INHALATION_SPRAY | CUTANEOUS | Status: DC | PRN

## 2018-02-14 MED ORDER — SIMETHICONE 80 MG PO CHEW: 80.0000 mg | CHEWABLE_TABLET | ORAL | Status: DC | PRN

## 2018-02-14 MED ORDER — ONDANSETRON HCL 4 MG/2ML IJ SOLN: 4.0000 mg | INTRAMUSCULAR | Status: DC | PRN

## 2018-02-14 MED ORDER — COCONUT OIL OIL: 1.0000 "application " | TOPICAL_OIL | Status: DC | PRN

## 2018-02-14 MED ORDER — DIPHENHYDRAMINE HCL 25 MG PO CAPS: 25.0000 mg | ORAL_CAPSULE | Freq: Four times a day (QID) | ORAL | Status: DC | PRN

## 2018-02-14 MED ORDER — IBUPROFEN 600 MG PO TABS
600.0000 mg | ORAL_TABLET | Freq: Four times a day (QID) | ORAL | Status: DC
  Administered 2018-02-14 – 2018-02-15 (×6): 600 mg via ORAL
  Filled 2018-02-14 (×6): qty 1

## 2018-02-14 MED ORDER — ACETAMINOPHEN 325 MG PO TABS: 650.0000 mg | ORAL_TABLET | ORAL | Status: DC | PRN

## 2018-02-14 MED ORDER — ZOLPIDEM TARTRATE 5 MG PO TABS: 5.0000 mg | ORAL_TABLET | Freq: Every evening | ORAL | Status: DC | PRN

## 2018-02-14 MED ORDER — WITCH HAZEL-GLYCERIN EX PADS: 1.0000 "application " | MEDICATED_PAD | CUTANEOUS | Status: DC | PRN

## 2018-02-14 NOTE — Progress Notes (Signed)
Post Partum Day 1 Subjective: no complaints, up ad lib, voiding, tolerating PO and + flatus  Objective: Blood pressure (!) 91/57, pulse (!) 58, temperature 98.9 F (37.2 C), temperature source Oral, resp. rate 18, height 4' 10.7" (1.491 m), weight 170 lb (77.1 kg), last menstrual period 05/31/2017.  Physical Exam:  General: alert, cooperative and no distress Lochia: appropriate Uterine Fundus: firm Incision: N/A DVT Evaluation: No evidence of DVT seen on physical exam. No cords or calf tenderness. No significant calf/ankle edema.  Recent Labs    02/13/18 2129  HGB 14.6  HCT 44.5    Assessment/Plan: Plan for discharge tomorrow   LOS: 1 day   Caryl Ada, DO 02/14/2018, 2:43 PM

## 2018-02-14 NOTE — Lactation Note (Signed)
This note was copied from a baby's chart. Lactation Consultation Note Mom spanish, dexture international interpreter Judeth Cornfield # [phone] Babies 7 hrs old. Twins, LPI information sheet given and reviewed.  Mom BF babies for 10 min. Prior to [facility] coming to rm. Mom put babies back in crib w/o supplementing. Encouraged to supplement after BF 22 cal. Similac.  Mom has "V" shaped breast w/everted nipples at the bottom of breast. Colostrum expressed. Encouraged to breast massage occasionally during BF. Mom has 3 yr old and 50 yr old that she BF for 1 1/2 yrs. Newborn behavior LPI discussed. Encouraged to wake for feedings if hasn't cued in 3 hrs.  Mom encouraged to feed baby 8-12 times/24 hours and with feeding cues. Mom shown how to use DEBP & how to disassemble, clean, & reassemble parts. Mom knows to pump q3h for 15-20 min.  Encouraged to call for assistance or questions.  WH/LC brochure given w/resources, support groups and LC services.  Patient Name: Angel Swanson [id number] Date: 02/14/2018 Reason for consult: Initial assessment;Infant < 6lbs;Multiple gestation;Late-preterm 34-36.6wks   Maternal Data Has patient been taught Hand Expression?: Yes Does the patient have breastfeeding experience prior to this delivery?: Yes  Feeding Feeding Type: Breast Fed Length of feed: 10 min  LATCH Score Latch: Grasps breast easily, tongue down, lips flanged, rhythmical sucking.  Audible Swallowing: A few with stimulation  Type of Nipple: Everted at rest and after stimulation  Comfort (Breast/Nipple): Soft / non-tender  Hold (Positioning): Assistance needed to correctly position infant at breast and maintain latch.  LATCH Score: 8  Interventions Interventions: Breast feeding basics reviewed;Support pillows;Position options;Skin to skin;Breast massage;Hand express;Breast compression;DEBP  Lactation Tools Discussed/Used Tools: Pump Breast pump type: Double-Electric Breast Pump WIC  Program: Yes Pump Review: Setup, frequency, and cleaning;Milk Storage Initiated by:: Peri Jefferson RN IBCLC Date initiated:: 02/14/18   Consult Status Consult Status: Follow-up Date: 02/15/18 Follow-up type: In-patient    Charyl Dancer 02/14/2018, 5:51 AM

## 2018-02-15 ENCOUNTER — Ambulatory Visit: Payer: Self-pay

## 2018-02-15 ENCOUNTER — Encounter (HOSPITAL_COMMUNITY): Payer: Self-pay | Admitting: *Deleted

## 2018-02-15 MED ORDER — IBUPROFEN 600 MG PO TABS: 600.0000 mg | ORAL_TABLET | Freq: Four times a day (QID) | ORAL | 0 refills | Status: DC | PRN

## 2018-02-15 NOTE — Lactation Note (Signed)
This note was copied from a baby's chart. Lactation Consultation Note  Patient Name: Angel Swanson [id number] Date: 02/15/2018  In house Spanish interpreter here for visit.  Mom states she puts babies to breast but they are still hungry after because her milk is not in yet.  She is supplementing with 22 calorie formula and they are taking 10-20 mls every 3 hours.  Mom states she is pumping and obtaining small amounts of colostrum.  Instructed to pump every 3 hours.  Encouraged to call for assist prn.   Maternal Data    Feeding Feeding Type: Formula Nipple Type: Slow - flow  LATCH Score Latch: (instructed MOB to call for next latch)                 Interventions    Lactation Tools Discussed/Used     Consult Status      Huston Foley 02/15/2018, 2:08 PM

## 2018-02-15 NOTE — Anesthesia Postprocedure Evaluation (Signed)
Anesthesia Post Note  Patient: Angel Swanson, Angel Swanson  Procedure(s) Performed: AN AD HOC LABOR EPIDURAL     Patient location during evaluation: Mother Baby Anesthesia Type: Epidural Pain management: pain level controlled Vital Signs Assessment: post-procedure vital signs reviewed and stable Respiratory status: spontaneous breathing Cardiovascular status: blood pressure returned to baseline and stable Postop Assessment: no headache, no backache, epidural receding, patient able to bend at knees, adequate PO intake, able to ambulate and no apparent nausea or vomiting Anesthetic complications: no    Last Vitals:  Vitals:   02/14/18 1803 02/15/18 0538  BP: 102/67 (!) 106/47  Pulse: 70 67  Resp: 17 17  Temp: 37.2 C 36.5 C    Last Pain:  Vitals:   02/15/18 0820  TempSrc:   PainSc: 0-No pain   Pain Goal: Patients Stated Pain Goal: 0 (02/13/18 2058)               Angel Swanson

## 2018-02-15 NOTE — Plan of Care (Signed)
  Problem: Health Behavior/Discharge Planning: Goal: Ability to manage health-related needs will improve Outcome: Completed/Met  Discharge paperwork and instructions discussed.  Reasons to call the MD reviewed.  In house interpreter, Ruben Gottron, used for all teaching.  Pt verbalized understanding.

## 2018-02-15 NOTE — Lactation Note (Signed)
This note was copied from a baby's chart. Lactation Consultation Note  Patient Name: Boy Verlon Carcione [id number] Date: 02/15/2018 Reason for consult: Follow-up assessment;Multiple gestation;Infant < 6lbs;Late-preterm 34-36.6wks  25 hours old late preterm twin boys who are being partially BF and formula fed according to LPI protocol. Mom was nursing Twin A on lying down position when entering the room, both babies where completely dressed (had clothes on) plus swaddled when entering the room. Mom was nursing twin A like that, advised to do STS in posterior feedings. Baby feel asleep shortly after feeding in just 10 minutes.   Mom has not been pumping at all, she stated that "she doesn't have anything yet". Explained to mom the importance of consistent pumping. She verbalized understanding and told LC she'll start pumping again tomorrow. Mom started to supplement baby A with Similac 22 soon after he was done BF. She needed additional bottles and nipples for next feeding.  Mom is Spanish speaking assisted her with getting 2 more bottles of Similac 22 formula plus extra bottle nipples. Mom still bottle feeding Baby A when exiting the room, she voiced she'll feed baby B once she's done with baby A.  Encouraged mom to keep feeding babies 8-12 times/24 hours and supplement with Similac 22 calorie formula according to babies' age. Mom will spoon or finger feed any amount of EBM she may get through pumping. She's aware of LC services and will call PRN.  Maternal Data    Feeding Feeding Type: Breast Fed Length of feed: 10 min  LATCH Score Latch: Grasps breast easily, tongue down, lips flanged, rhythmical sucking.  Audible Swallowing: None  Type of Nipple: Everted at rest and after stimulation  Comfort (Breast/Nipple): Soft / non-tender  Hold (Positioning): Assistance needed to correctly position infant at breast and maintain latch.(Mom required minimal assistance)  LATCH Score:  7  Interventions Interventions: Breast feeding basics reviewed  Lactation Tools Discussed/Used     Consult Status Consult Status: Follow-up Date: 02/15/18 Follow-up type: In-patient    Rilla Buckman Venetia Constable 02/15/2018, 12:00 AM

## 2018-02-15 NOTE — Discharge Instructions (Signed)
Parto vaginal, cuidados posteriores  Vaginal Delivery, Care After  Siga estas instrucciones durante las prximas semanas. Estas indicaciones le proporcionan informacin acerca de cmo deber cuidarse despus del parto vaginal. Su mdico tambin podr darle indicaciones ms especficas. El tratamiento ha sido planificado segn las prcticas mdicas actuales, pero en algunos casos pueden ocurrir problemas. Llame al mdico si tiene problemas o preguntas.  Qu puedo esperar despus del procedimiento?  Despus de un parto vaginal, es frecuente tener lo siguiente:   Hemorragia leve de la vagina.   Dolor en el abdomen, la vagina y la zona de la piel entre la abertura vaginal y el ano (perineo).   Calambres plvicos.   Fatiga.    Siga estas indicaciones en su casa:  Medicamentos   Tome los medicamentos de venta libre y los recetados solamente como se lo haya indicado el mdico.   Si le recetaron un antibitico, tmelo como se lo haya indicado el mdico. No interrumpa la administracin del antibitico hasta que lo haya terminado.  Conducir     No conduzca ni opere maquinaria pesada mientras toma analgsicos recetados.   No conduzca durante 24horas si le administraron un sedante.  Estilo de vida   No beba alcohol. Esto es de suma importancia si est amamantando o toma analgsicos.   No consuma productos que contengan tabaco, incluidos cigarrillos, tabaco de mascar o cigarrillos electrnicos. Si necesita ayuda para dejar de fumar, consulte al mdico.  Qu debe comer y beber   Beba al menos 8vasos de ochoonzas (240cc) de agua todos los das a menos que el mdico le indique lo contrario. Si elige amamantar al beb, quiz deba beber an ms cantidad de agua.   Coma alimentos ricos en fibras todos los das. Estos alimentos pueden ayudarla a prevenir o aliviar el estreimiento. Los alimentos ricos en fibras incluyen, entre otros:  ? Panes y cereales integrales.  ? Arroz integral.  ? Frijoles.  ? Frutas y verduras  frescas.  Actividad   Retome sus actividades normales como se lo haya indicado el mdico. Pregntele al mdico qu actividades son seguras para usted.   Descanse todo lo que pueda. Trate de descansar o tomar una siesta mientras el beb est durmiendo.   No levante objetos que pesen ms que su beb o 10libras (4,5kg) hasta que el mdico le diga que es seguro.   Hable con el mdico sobre cundo puede retomar la actividad sexual. Esto puede depender de lo siguiente:  ? Riesgo de sufrir una infeccin.  ? Velocidad de cicatrizacin.  ? Comodidad y deseo de retomar la actividad sexual.  Cuidados vaginales   Si le realizaron una episiotoma o tuvo un desgarro vaginal, contrlese la zona todos los das para detectar signos de infeccin. Est atenta a los siguientes signos:  ? Aumento del enrojecimiento, la hinchazn o el dolor.  ? Mayor presencia de lquido o sangre.  ? Calor.  ? Pus o mal olor.   No use tampones ni se haga duchas vaginales hasta que el mdico la autorice.   Controle la sangre que elimina por la vagina para detectar cogulos de sangre. Estos pueden tener el aspecto de grumos de color rojo oscuro, o secrecin marrn o negra.  Instrucciones generales   Mantenga el perineo limpio y seco, como se lo haya indicado el mdico.   Use ropa cmoda y suelta.   Cuando vaya al bao, siempre higiencese de adelante hacia atrs.   Pregntele al mdico si puede ducharse o tomar baos de inmersin.   Si se le realiz una episiotoma o tuvo un desgarro perineal durante el trabajo del parto o el parto, es posible que el mdico le indique que no tome baos de inmersin durante un determinado tiempo.   Use un sostn que sujete y ajuste bien sus pechos.   Si es posible, pdale a alguien que la ayude con las tareas del hogar y a cuidar del beb durante al menos algunos das despus de que le den el alta del hospital.   Concurra a todas las visitas de seguimiento para usted y el beb, como se lo haya indicado el  mdico. Esto es importante.  Comunquese con un mdico si:   Tiene los siguientes sntomas:  ? Secrecin vaginal que tiene mal olor.  ? Dificultad para orinar.  ? Dolor al orinar.  ? Aumento o disminucin repentinos de la frecuencia de las deposiciones.  ? Ms enrojecimiento, hinchazn o dolor alrededor de la episiotoma o del desgarro vaginal.  ? Ms secrecin de lquido o sangre de la episiotoma o del desgarro vaginal.  ? Pus o mal olor proveniente de la episiotoma o del desgarro vaginal.  ? Fiebre.  ? Erupcin cutnea.  ? Poco inters o falta de inters en actividades que solan gustarle.  ? Dudas sobre su cuidado y el del beb.   Siente la episiotoma o el desgarro vaginal caliente al tacto.   La episiotoma o el desgarro vaginal se abren o no parecen cicatrizar.   Siente dolor en las mamas, o estn duras o enrojecidas.   Siente tristeza o preocupacin de forma inusual.   Siente nuseas o vomita.   Elimina cogulos de sangre grandes por la vagina. Si expulsa un cogulo de sangre por la vagina, gurdelo para mostrrselo a su mdico. No tire la cadena sin que el mdico examine el cogulo de sangre antes.   Orina ms de lo habitual.   Se siente mareada o se desmaya.   No ha amamantado para nada y no ha tenido un perodo menstrual durante 12 semanas despus del parto.   Dej de amamantar al beb y no ha tenido su perodo menstrual durante 12 semanas despus de dejar de amamantar.  Solicite ayuda de inmediato si:   Tiene los siguientes sntomas:  ? Dolor que no desaparece o no mejora con medicamentos.  ? Dolor en el pecho.  ? Dificultad para respirar.  ? Visin borrosa o manchas en la vista.  ? Pensamientos de autolesionarse o lesionar al beb.   Comienza a sentir dolor en el abdomen o en una de las piernas.   Presenta un dolor de cabeza intenso.   Se desmaya.   Tiene una hemorragia de la vagina tan intensa que empapa dos toallitas sanitarias en una hora.  Esta informacin no tiene como fin  reemplazar el consejo del mdico. Asegrese de hacerle al mdico cualquier pregunta que tenga.  Document Released: 09/09/2005 Document Revised: 01/01/2017 Document Reviewed: 09/24/2015  Elsevier Interactive Patient Education  2018 Elsevier Inc.

## 2018-02-15 NOTE — Discharge Summary (Addendum)
OB Discharge Summary     Patient Name: Angel Swanson DOB: May 20, 1991 MRN: [id number]  Date of admission: 02/13/2018 Delivering MD:    Angel Swanson [[id number]]  Angel Swanson [[id number]]  [facility], [place]   Date of discharge: 02/15/2018  Admitting diagnosis: 37 WKS CTX Intrauterine pregnancy: [redacted]w[redacted]d     Secondary diagnosis:  Active Problems:   Monochorionic diamniotic twin gestation   Language barrier   SVD (spontaneous vaginal delivery)  Additional problems: none     Discharge diagnosis: Late preterm pregnancy delivery at [redacted]w[redacted]d                                                                                                Post partum procedures:none  Augmentation: no  Complications: None  Hospital course:  Onset of Labor With Vaginal Delivery     27 y.o. yo G3P2002 at [redacted]w[redacted]d was admitted in Active Labor on 02/13/2018. Patient had an uncomplicated labor course as follows:  Membrane Rupture Time/Date: intrapartum She progressed without augmentation to complete and pushed less than 10 minutes to deliver baby A.  Baby B's position was confirmed vertex by [place] after delivery of baby A. Intrapartum AROM of Baby B. Patient pushed x1 ctx to deliver baby B.     Angel Swanson [[id number]]      Angel Swanson [[id number]]  10:50 PM ,   Angel Swanson [[id number]]      Angel Swanson [[id number]]  02/13/2018   Intrapartum Procedures: Episiotomy:    Angel Swanson [[id number]]  None [1]   Angel Swanson [[id number]]  None [1]                                         Lacerations:     Angel Swanson [[id number]]  None [1]   Angel Swanson [[id number]]  None [1]  Patient had a delivery of two Viable infants.   Angel Swanson [[id number]]  02/13/2018   Angel Swanson [[id number]]   02/13/2018 Information for the patient's newborn:  Angel Swanson [[id number]]  Delivery Method: Vaginal, Spontaneous(Filed from Delivery Summary) Information for the patient's newborn:  Angel Swanson [[id number]]  Delivery Method: Vaginal, Spontaneous(Filed from Delivery Summary)    Pateint had an uncomplicated postpartum course.  She is ambulating, tolerating a regular diet, passing flatus, and urinating well. Patient is discharged home in stable condition on 02/15/18.   Physical exam  Vitals:   02/14/18 0627 02/14/18 1154 02/14/18 1803 02/15/18 0538  BP: 99/67 (!) 91/57 102/67 (!) 106/47  Pulse: 68 (!) 58 70 67  Resp: Temp: 98.3 F (36.8 C) 98.9 F (37.2 C) 98.9 F (37.2 C) 97.7 F (36.5 C)  TempSrc: Oral Oral Oral Oral  Weight:      Height:       General: alert, cooperative and no distress Lochia: appropriate Uterine Fundus: firm Incision: N/A DVT Evaluation: No evidence of DVT seen on physical exam.  Labs: Lab Results  Component Value Date   WBC 17.8 (H) 02/13/2018   HGB 14.6 02/13/2018   HCT 44.5 02/13/2018   MCV 86.9 02/13/2018   PLT 281 02/13/2018   No flowsheet data found.  Discharge instruction: per After Visit Summary and "Baby and Me Booklet".  After visit meds:  Allergies as of 02/15/2018   No Known Allergies     Medication List    TAKE these medications   ibuprofen 600 MG tablet Commonly known as:  ADVIL,MOTRIN Take 1 tablet (600 mg total) by mouth every 6 (six) hours as needed.   prenatal vitamin w/FE, FA 27-1 MG Tabs tablet Take 1 tablet by mouth daily at 12 noon.       Diet: routine diet  Activity: Advance as tolerated. Pelvic rest for 6 weeks.   Outpatient follow up:4 weeks Follow up Appt: Future Appointments  Date Time Provider Department Center  03/13/2018 10:15 AM Marny Lowenstein, PA-C [facility]   Follow up Visit:No follow-ups on file.  Postpartum contraception: Nexplanon  Newborn  Data:   Angel Swanson [[id number]]  Live born female  Birth Weight: 4 lb 12.5 oz (2170 g) APGAR: 8, 9  Newborn Delivery   Birth date/time:  02/13/2018 22:48:00 Delivery type:  Vaginal, Spontaneous      Angel Swanson [[id number]]  Live born female  Birth Weight: 4 lb 3.2 oz (1905 g) APGAR: 7, 9  Newborn Delivery   Birth date/time:  02/13/2018 22:52:00 Delivery type:  Vaginal, Spontaneous     Baby Feeding: Bottle and Breast Disposition:home with mother   02/15/2018 Angel Bonier, MD, PGY-1 Family Medicine - [facility] I examined this pt and agree with the above assessment

## 2018-02-16 ENCOUNTER — Ambulatory Visit: Payer: Self-pay

## 2018-02-16 NOTE — Lactation Note (Signed)
This note was copied from a baby's chart. Lactation Consultation Note  Patient Name: Angel Swanson [id number] Date: 02/16/2018   Infant B was observed at breast & doing quite well (suck:swallow ratio of 1:1). Mom says that Baby A also breastfeeds as well.  Mom is offering breast first & then offering a bottle. Per parents, babies are bottle feeding well (and take their needed volume in 10-15 minutes). Parents report excellent output.   Mom has not had time to use her DEBP. Per Mom, with her 2 older children (now 9yo & 7yo), she chose to mixed feed, also. Mom does not feel that she needs a DEBP for home. She is content with a manual pump. I showed her how to use the manual pump (double & single mode).   Bonnye Fava, interpreter, was present for majority of consult. Lurline Hare [facility] 02/16/2018, 4:23 PM

## 2018-02-17 ENCOUNTER — Ambulatory Visit: Payer: Self-pay

## 2018-02-17 ENCOUNTER — Encounter: Payer: Self-pay | Admitting: *Deleted

## 2018-02-17 LAB — GC/CHLAMYDIA PROBE AMP (~~LOC~~) NOT AT ARMC
Chlamydia: NEGATIVE
NEISSERIA GONORRHEA: NEGATIVE

## 2018-02-17 NOTE — Lactation Note (Addendum)
This note was copied from a baby's chart. Lactation Consultation Note  Patient Name: Angel Swanson [id number] Date: 02/17/2018 Reason for consult: Infant < 6lbs   Video interpreter used for Spanish. Twins 45 hours old.  [redacted]w[redacted]d. < 5 lbs. Mother is breastfeeding and giving formula after.  She is pumping w/ double manual which she prefers. Observed feeding of Baby A with intermittent swallows for 10 min.  Baby was latched upon entering. Recommend mother post pump 4-6 times per day for 10-20 min with DEBP on initiation setting, more if able. Give baby back volume pumped at the next feeding. Discussed paced feeding with bottle feeding. Mother has been pumping approx 5 ml.   Reviewed engorgement care and monitoring voids/stools. Stools are seedy and yellow/brown per parents.  Mom encouraged to feed baby 8-12 times/24 hours and with feeding cues at least q 3 hours.      Maternal Data    Feeding Feeding Type: Breast Fed Nipple Type: Slow - flow Length of feed: 10 min  LATCH Score Latch: Grasps breast easily, tongue down, lips flanged, rhythmical sucking.(latched upon entering )  Audible Swallowing: A few with stimulation  Type of Nipple: Everted at rest and after stimulation  Comfort (Breast/Nipple): Soft / non-tender  Hold (Positioning): No assistance needed to correctly position infant at breast.  LATCH Score: 9  Interventions    Lactation Tools Discussed/Used     Consult Status Consult Status: Follow-up Date: 02/18/18 Follow-up type: In-patient    Dahlia Byes [facility] 02/17/2018, 9:58 AM

## 2018-03-13 ENCOUNTER — Encounter: Payer: Self-pay | Admitting: Medical

## 2018-03-13 ENCOUNTER — Ambulatory Visit (INDEPENDENT_AMBULATORY_CARE_PROVIDER_SITE_OTHER): Payer: Self-pay | Admitting: Medical

## 2018-03-13 NOTE — Patient Instructions (Addendum)
Etonogestrel implant Qu es este medicamento? El ETONOGESTREL es un dispositivo anticonceptivo (control de la natalidad). Se usa para evitar los embarazos. Se puede usar hasta por 3 aos. Este medicamento puede ser utilizado para otros usos; si tiene alguna pregunta consulte con su proveedor de atencin mdica o con su farmacutico. MARCAS COMUNES: Implanon, Nexplanon Qu le debo informar a mi profesional de la salud antes de tomar este medicamento? Necesita saber si usted presenta alguno de los siguientes problemas o situaciones: sangrado vaginal anormal enfermedad vascular o cogulos sanguneos cncer de mama, cervical, heptico depresin diabetes enfermedad de la vescula biliar dolores de cabeza enfermedad cardiaca o ataque cardiaco reciente alta presin sangunea alto nivel de colesterol enfermedad renal enfermedad heptica convulsiones fuma tabaco una reaccin alrgica o inusual al etonogestrel, otras hormonas, anestsicos o antispticos, medicamentos, alimentos, colorantes o conservantes si est embarazada o buscando quedar embarazada si est amamantando a un beb Cmo debo utilizar este medicamento? Un profesional de la salud inserta este dispositivo justo debajo de la piel en la parte interior del brazo. Hable con su pediatra para informarse acerca del uso de este medicamento en nios. Puede requerir atencin especial. Sobredosis: Pngase en contacto inmediatamente con un centro toxicolgico o una sala de urgencia si usted cree que haya tomado demasiado medicamento. ATENCIN: Este medicamento es solo para usted. No comparta este medicamento con nadie. Qu sucede si me olvido de una dosis? No se aplica en este caso. Qu puede interactuar con este medicamento? No tome este medicamento con ninguno de los siguientes frmacos: amprenavir bosentano fosamprenavir Este medicamento tambin podra interactuar con los siguientes medicamentos: barbitricos para inducir el sueo o para el  tratamiento de convulsiones ciertos medicamentos para infecciones micticas, tales como itraconazol y ketoconazol jugo de toronja griseofulvina medicamentos para tratar convulsiones, tales como carbamazepina, felbamato, oxcarbazepina, fenitona, topiramato modafinilo fenilbutazona rifampicina rufinamida algunos medicamentos para tratar la infeccin por el VIH, tales como atazanavir, indinavir, lopinavir, nelfinavir, tipranavir, ritonavir hierba de [place] Puede ser que esta lista no menciona todas las posibles interacciones. Informe a su profesional de la salud de todos los productos a base de hierbas, medicamentos de venta libre o suplementos nutritivos que est tomando. Si usted fuma, consume bebidas alcohlicas o si utiliza drogas ilegales, indqueselo tambin a su profesional de la salud. Algunas sustancias pueden interactuar con su medicamento. A qu debo estar atento al usar este medicamento? Este producto no protege contra la infeccin por el VIH (SIDA) u otras enfermedades de transmisin sexual. Usted debe sentir el implante al presionar con las yemas de los dedos sobre la piel donde se insert. Contacte a su mdico si no se siente el implante y usa un mtodo anticonceptivo no hormonal (como el condn) hasta que el mdico confirma que el implante est en su lugar. Si siente que el implante puede haber roto o doblado en su brazo, pngase en contacto con su proveedor de atencin mdica. Qu efectos secundarios puedo tener al utilizar este medicamento? Efectos secundarios que debe informar a su mdico o a su profesional de la salud tan pronto como sea posible: reacciones alrgicas, como erupcin cutnea, picazn o urticarias, e hinchazn de la cara, los labios o la lengua bultos en las mamas cambios en las emociones o el estado de nimo estado de nimo deprimido sangrado menstrual intenso o prolongado dolor, irritacin, hinchazn o moretones en el lugar de la insercin cicatriz en el lugar de la  insercin signos de infeccin en el sitio de insercin tales como fiebre, y enrojecimiento de   la piel, dolor o secrecin signos de Psychiatristembarazo signos y sntomas de un cogulo sanguneo, tales como problemas respiratorios; cambios en la visin; dolor en el pecho; dolor de cabeza severo, repentino; dolor, hinchazn, calor en la pierna; dificultad para hablar; entumecimiento o debilidad repentina de la cara, el brazo o la pierna signos y sntomas de lesin al hgado, como orina amarilla oscura o [place]; sensacin general de estar enfermo o sntomas gripales; heces claras; prdida de apetito; nuseas; dolor en la regin abdominal superior derecha; cansancio o debilidad inusual; color amarillento de los ojos o la piel sangrado vaginal inusual, secrecin signos y sntomas de un derrame cerebral, tales como cambios en la visin; confusin; dificultad para hablar o entender; dolores de cabeza severos; entumecimiento o debilidad repentina de la cara, el brazo o la pierna; problemas al Advertising account plannercaminar; Psychiatristmareo; prdida del equilibrio o la coordinacin Efectos secundarios que generalmente no requieren Psychologist, prison and probation servicesatencin mdica (infrmelos a su mdico o a Producer, television/film/videosu profesional de la salud si persisten o si son molestos): acn dolor de Retail buyerespalda dolor en las mamas cambios de peso mareos sensacin general de estar enfermo o sntomas gripales dolor de cabeza sangrado menstrual irregular nuseas dolor de garganta irritacin o inflamacin vaginal Puede ser que esta lista no menciona todos los posibles efectos secundarios. Comunquese a su mdico por asesoramiento mdico Hewlett-Packardsobre los efectos secundarios. Usted puede informar los efectos secundarios a la FDA por telfono al 1-800-FDA-1088. Dnde debo guardar mi medicina? Este medicamento se administra en hospitales o clnicas y no necesitar guardarlo en su domicilio. ATENCIN: Este folleto es un resumen. Puede ser que no cubra toda la posible informacin. Si usted tiene preguntas acerca de esta medicina,  consulte con su mdico, su farmacutico o su profesional de Radiographer, therapeuticla salud.  2018 Elsevier/Gold Standard (2016-10-10 00:00:00)   [facility] - [phone]

## 2018-03-13 NOTE — Progress Notes (Signed)
Subjective:     Angel Swanson is a 27 y.o. female who presents for a postpartum visit. She is 4 weeks postpartum following a spontaneous vaginal delivery of mono-di twins. I have fully reviewed the prenatal and intrapartum course. The delivery was at 36wk6d gestational weeks. Outcome: spontaneous vaginal delivery. Anesthesia: epidural. Postpartum course has been normal. Babies course has been normal. Baby is feeding by both breast and bottle - Similac Advance. Bleeding no bleeding. Bowel function is normal. Bladder function is normal. Patient is not sexually active. Contraception method is abstinence. Postpartum depression screening: negative.  The following portions of the patient's history were reviewed and updated as appropriate: allergies, current medications, past family history, past medical history, past social history, past surgical history and problem list.  Review of Systems Pertinent items are noted in HPI.   Objective:    BP 107/67   Pulse 73   Wt 156 lb 12.8 oz (71.1 kg)   Breastfeeding? Yes   BMI 31.99 kg/m   General:  alert and cooperative   Breasts:  deferred  Lungs: clear to auscultation bilaterally  Heart:  regular rate and rhythm, S1, S2 normal, no murmur, click, rub or gallop  Abdomen: soft, non-tender; bowel sounds normal; no masses,  no organomegaly   Vulva:  not evaluated  Vagina: not evaluated  Cervix:  not evaluated  Corpus: not examined  Adnexa:  not evaluated  Rectal Exam: Not performed.        Assessment:     Normal postpartum exam. Pap smear not done at today's visit. Last pap was normal 11/17/17.   Plan:    1. Contraception: abstinence, until Nexplanon placed at [facility] 2. Follow up in: as needed for GYN concerns or as needed.    Marny LowensteinWenzel, Kia Varnadore N, PA-C 03/13/2018 10:49 AM

## 2018-11-29 IMAGING — US US MFM OB FOLLOW-UP EACH ADDL GEST (MODIFY)
1 series · 12 of 28 positions shown · non-contrast
Comparison: none

[Series 1: us mfm ob follow-up each addl gest (modify) · 56 acquisitions, 12 frames shown]
[im 3/56]
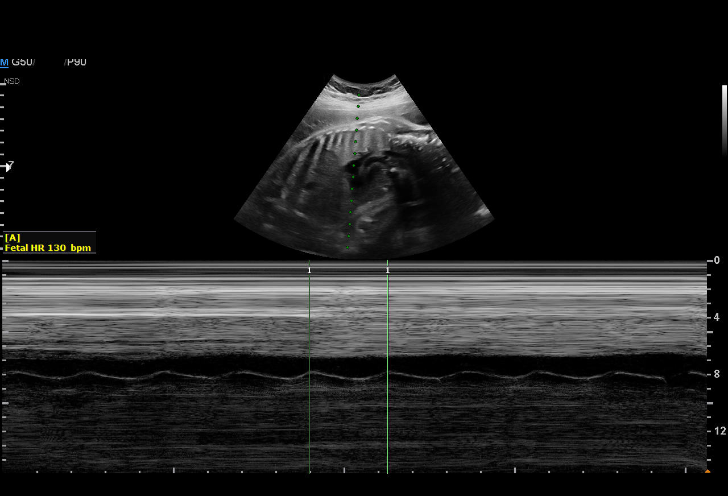
[im 7/56]
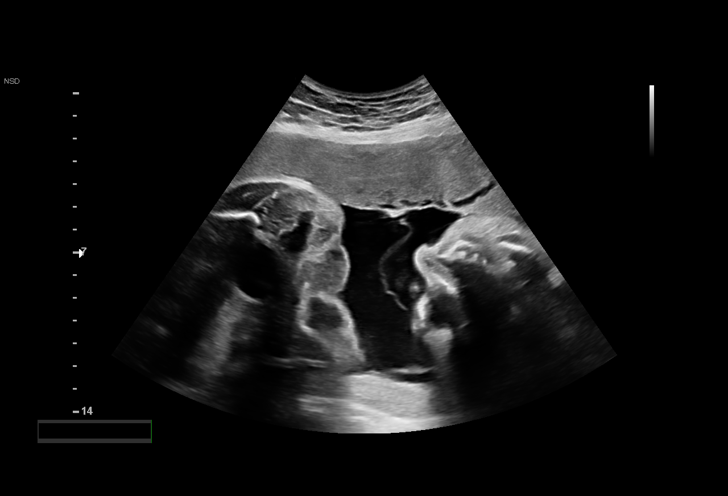
[im 11/56]
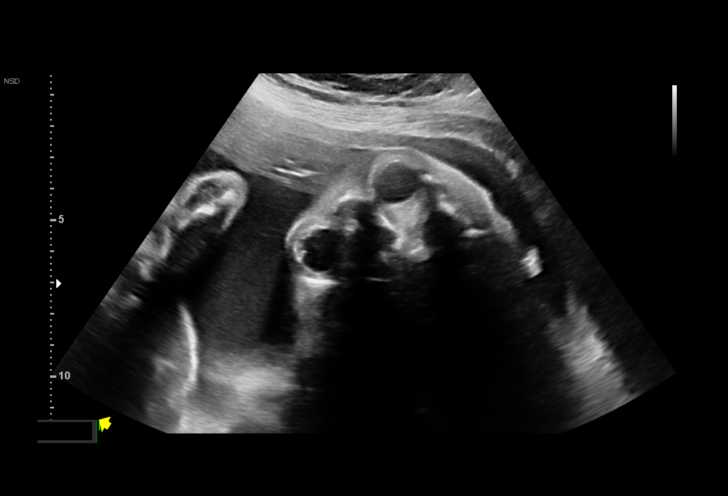
[im 17/56]
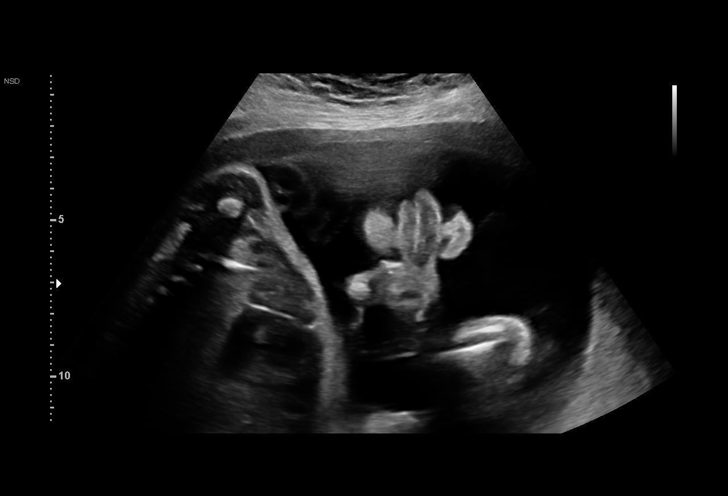
[im 21/56]
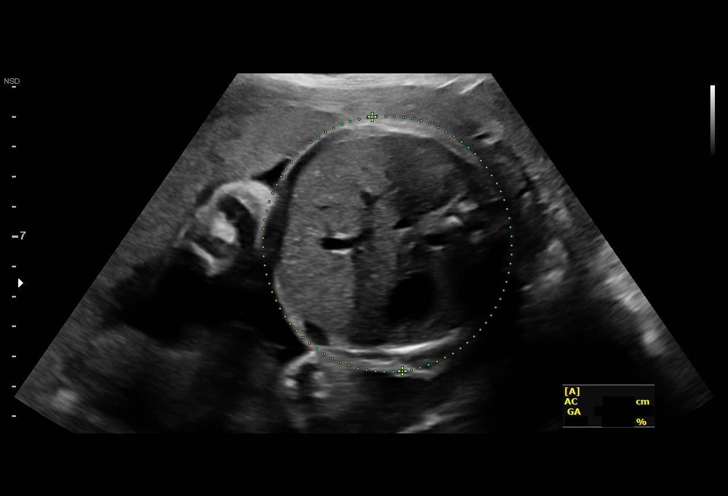
[im 25/56]
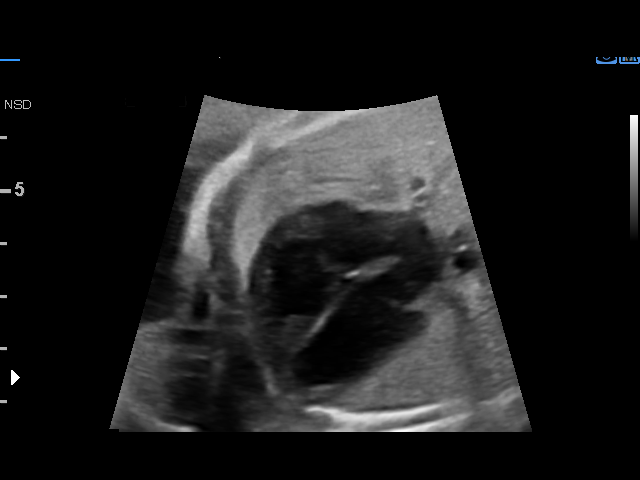
[im 31/56]
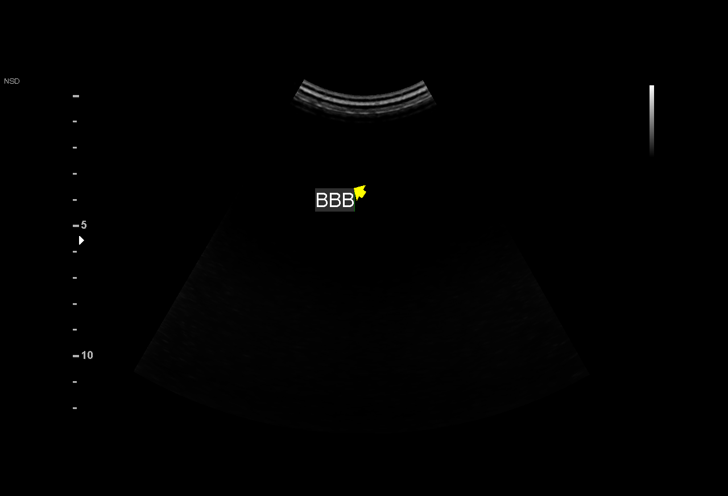
[im 35/56]
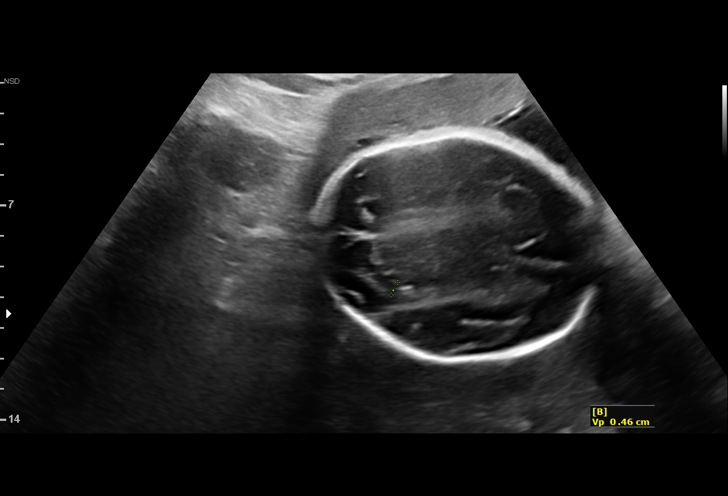
[im 39/56]
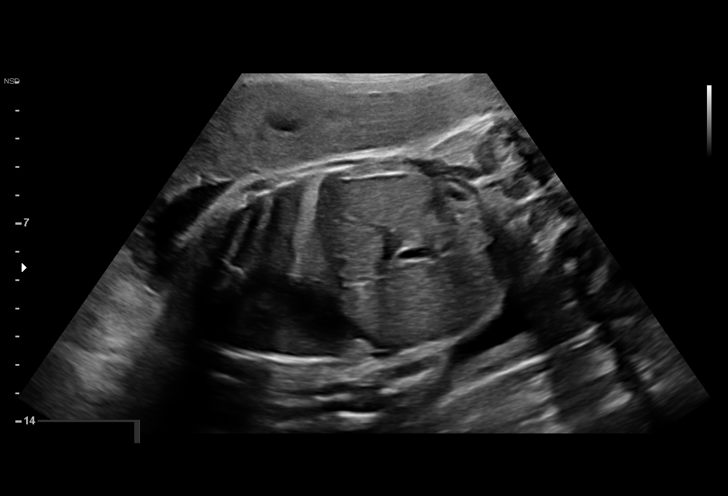
[im 45/56]
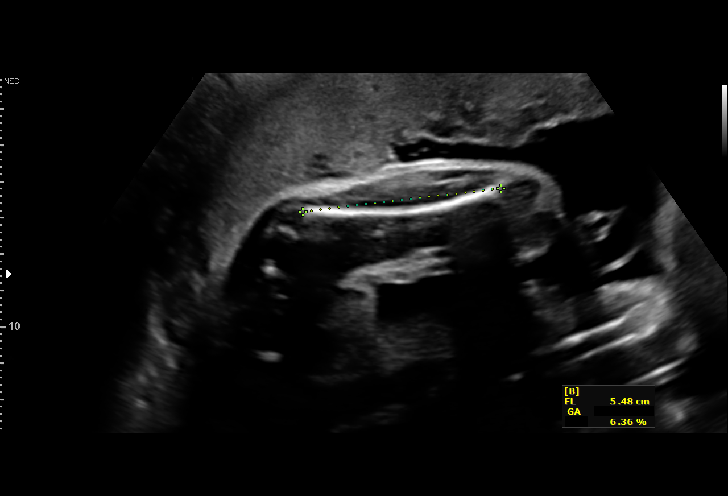
[im 49/56]
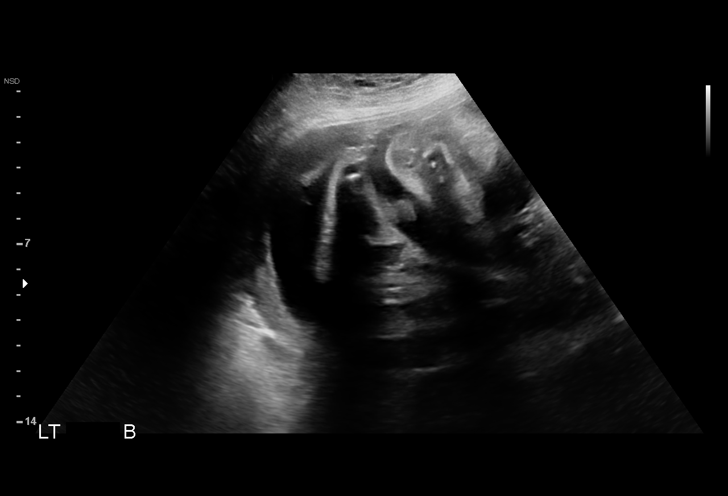
[im 53/56]
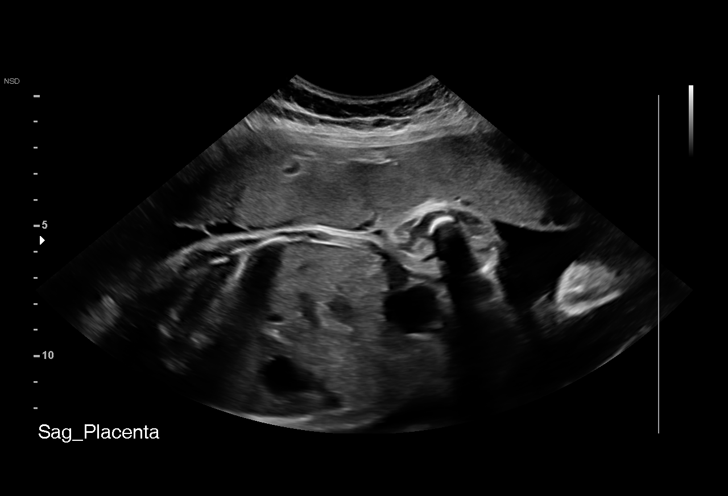

[12 of 28 positions shown; findings below may reference images not displayed]

[facility]

[facility] Clinic
Ref. Address:     Faculty

1  SACRI ANDA              [id number]      [id number]     [id number]
2  SACRI ANDA              [id number]      [id number]     [id number]
Indications

31 weeks gestation of pregnancy
Twin pregnancy, [place], second trimester
Antenatal follow-up for nonvisualized fetal
anatomy
OB History

Blood Type:            Height:  4'10"  Weight (lb):  163       BMI:
Gravidity:    3         Term:   2
Living:       2
Fetal Evaluation (Fetus A)

Num Of Fetuses:     2
Fetal Heart         130
Rate(bpm):
Cardiac Activity:   Observed
Fetal Lie:          Maternal left side
Presentation:       Cephalic
Placenta:           Anterior, above cervical os
P. Cord Insertion:  Previously Visualized
Membrane Desc:      Dividing Membrane seen - Monochorionic
Amniotic Fluid
AFI FV:      Subjectively within normal limits

Largest Pocket(cm)
7.7
Biometry (Fetus A)

BPD:      76.9  mm     G. Age:  30w 6d         14  %    CI:        76.43   %    70 - 86
FL/HC:      19.6   %    19.1 -
HC:      278.7  mm     G. Age:  30w 3d        < 3  %    HC/AC:      1.07        0.96 -
AC:      261.2  mm     G. Age:  30w 2d         10  %    FL/BPD:     71.0   %    71 - 87
FL:       54.6  mm     G. Age:  28w 6d        < 3  %    FL/AC:      20.9   %    20 - 24

Est. FW:    7487  gm      3 lb 4 oz     21  %     FW Discordancy         1  %
Gestational Age (Fetus A)

LMP:           31w 6d        Date:  05/31/17                 EDD:   03/07/18
U/S Today:     30w 1d                                        EDD:   03/19/18
Best:          31w 6d     Det. By:  LMP  (05/31/17)          EDD:   03/07/18
Anatomy (Fetus A)

Cranium:               Appears normal         Aortic Arch:            Previously seen
Cavum:                 Previously seen        Ductal Arch:            Previously seen
Ventricles:            Appears normal         Diaphragm:              Appears normal
Choroid Plexus:        Previously seen        Stomach:                Appears normal, left
sided
Cerebellum:            Previously seen        Abdomen:                Appears normal
Posterior Fossa:       Previously seen        Abdominal Wall:         Appears nml (cord
insert, abd wall)
Nuchal Fold:           Not applicable (>20    Cord Vessels:           Previously seen
wks GA)
Face:                  Appears normal         Kidneys:                Appear normal
(orbits and profile)
Lips:                  Appears normal         Bladder:                Appears normal
Thoracic:              Appears normal         Spine:                  Previously seen
Heart:                 Appears normal         Upper Extremities:      Previously seen
(4CH, axis, and situs
RVOT:                  Appears normal         Lower Extremities:      Previously seen
LVOT:                  Previously seen

Other:  Male gender. Heels previously visualized.

Fetal Evaluation (Fetus B)

Num Of Fetuses:     2
Fetal Heart         150
Rate(bpm):
Cardiac Activity:   Observed
Fetal Lie:          Maternal right side
Presentation:       Breech
Placenta:           Anterior, above cervical os
P. Cord Insertion:  Previously Visualized
Membrane Desc:      Dividing Membrane seen - Monochorionic

Amniotic Fluid
AFI FV:      Subjectively within normal limits
Largest Pocket(cm)
6.7
Biometry (Fetus B)

BPD:      74.4  mm     G. Age:  29w 6d          3  %    CI:        74.45   %    70 - 86
FL/HC:      20.0   %    19.1 -
HC:      273.7  mm     G. Age:  29w 6d        < 3  %    HC/AC:      1.03        0.96 -
AC:      265.1  mm     G. Age:  30w 4d         17  %    FL/BPD:     73.5   %    71 - 87
FL:       54.7  mm     G. Age:  28w 6d        < 3  %    FL/AC:      20.6   %    20 - 24

Est. FW:    9529  gm      3 lb 4 oz     21  %     FW Discordancy      0 \ 1 %
Gestational Age (Fetus B)

LMP:           31w 6d        Date:  05/31/17                 EDD:   03/07/18
U/S Today:     29w 6d                                        EDD:   03/21/18
Best:          31w 6d     Det. By:  LMP  (05/31/17)          EDD:   03/07/18
Anatomy (Fetus B)

Cranium:               Appears normal         Aortic Arch:            Previously seen
Cavum:                 Previously seen        Ductal Arch:            Previously seen
Ventricles:            Appears normal         Diaphragm:              Appears normal
Choroid Plexus:        Previously seen        Stomach:                Appears normal, left
sided
Cerebellum:            Previously seen        Abdomen:                Appears normal
Posterior Fossa:       Previously seen        Abdominal Wall:         Previously seen
Nuchal Fold:           Not applicable (>20    Cord Vessels:           Previously seen
wks GA)
Face:                  Orbits and profile     Kidneys:                Appear normal
previously seen
Lips:                  Appears normal         Bladder:                Appears normal
Thoracic:              Appears normal         Spine:                  Previously seen
Heart:                 Appears normal         Upper Extremities:      Previously seen
(4CH, axis, and situs
RVOT:                  Previously seen        Lower Extremities:      Previously seen
LVOT:                  Previously seen

Other:  Male gender.
Cervix Uterus Adnexa

Cervix
Normal appearance by transabdominal scan.
Impression

Monochorionic-diamniotic twin gestation at 31w 6d.
MVP A: 7.7, B: 6.7.
concordant growth (1% discordance), EFW A 21st%'ile, B
21st%'ile
interval fetal survey of both twins without dymorphic features
Stomach and bladder seen x 2.
No evidence of TTTS.
Recommendations

Given Sorin Oxendine gestation, weekly BPP beginning at 32
weeks with interval growth in 4 weeks.

## 2018-12-03 IMAGING — US USMFM FETAL BPP W/O NON-STRESS ADDL GEST
1 series · 14 of 28 positions shown · non-contrast
Comparison: none

[Series 1: usmfm fetal bpp w/o non-stress addl gest · 40 acquisitions, 14 frames shown]
[im 2/40]
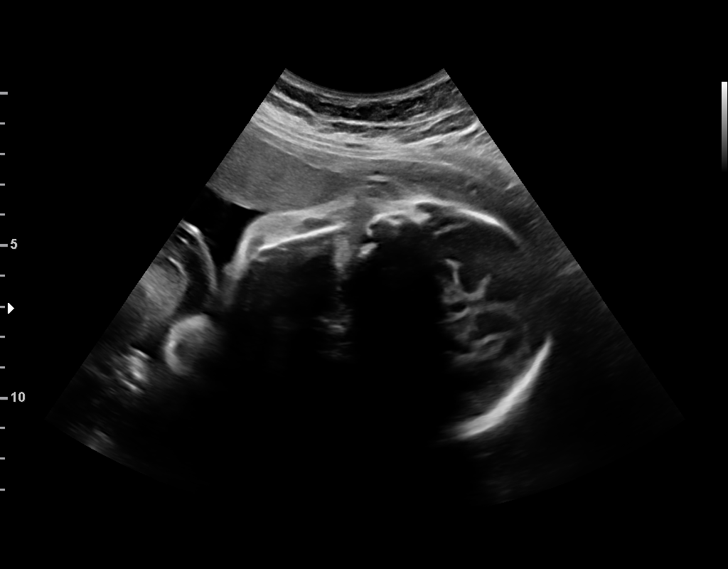
[im 5/40]
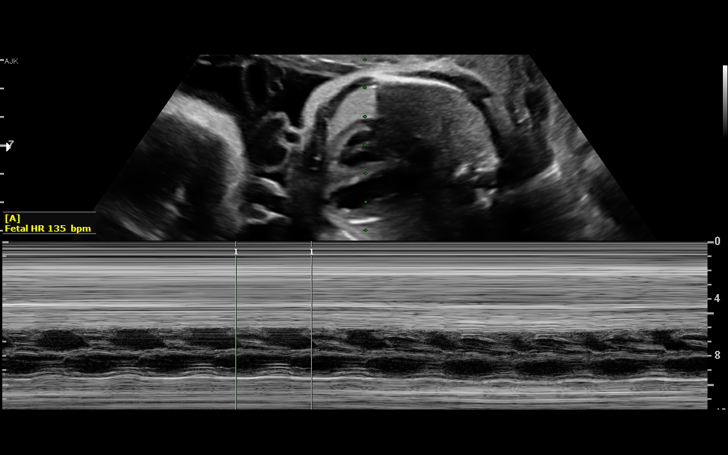
[im 8/40]
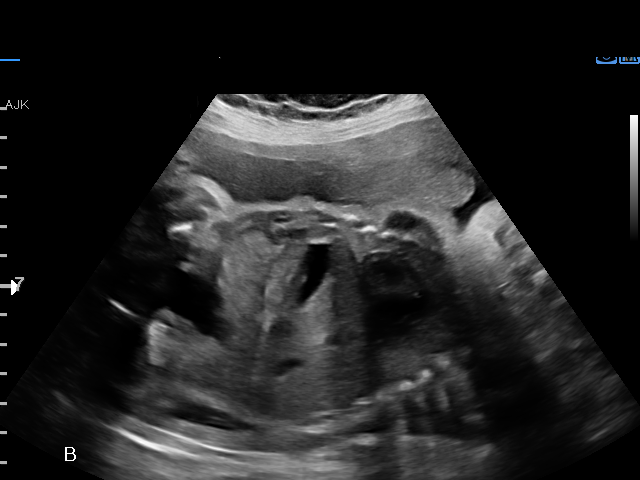
[im 11/40]
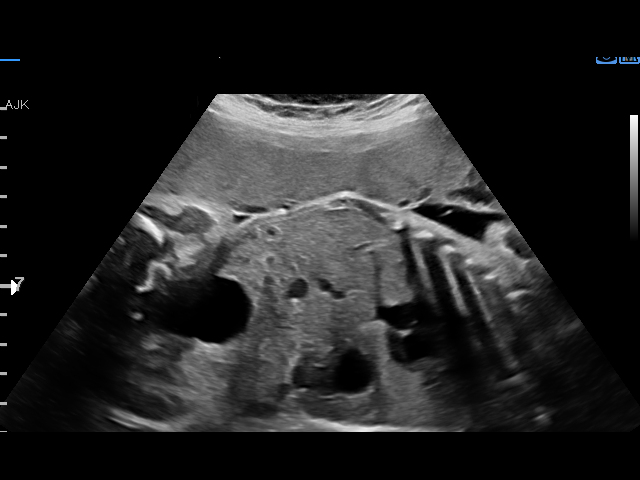
[im 14/40]
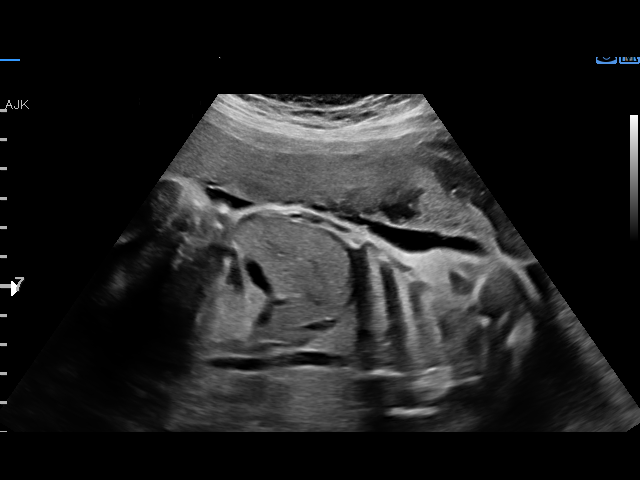
[im 16/40]
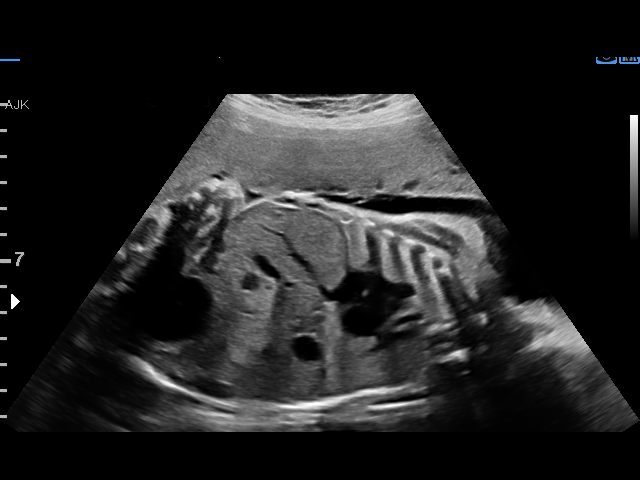
[im 19/40]
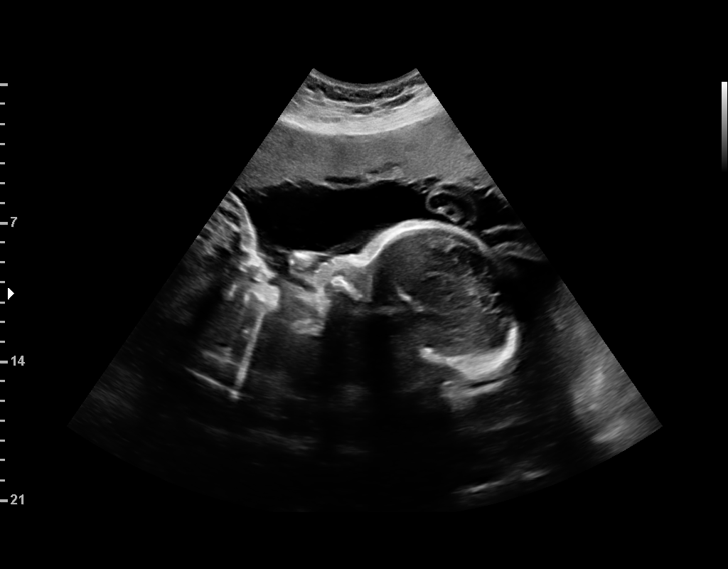
[im 22/40]
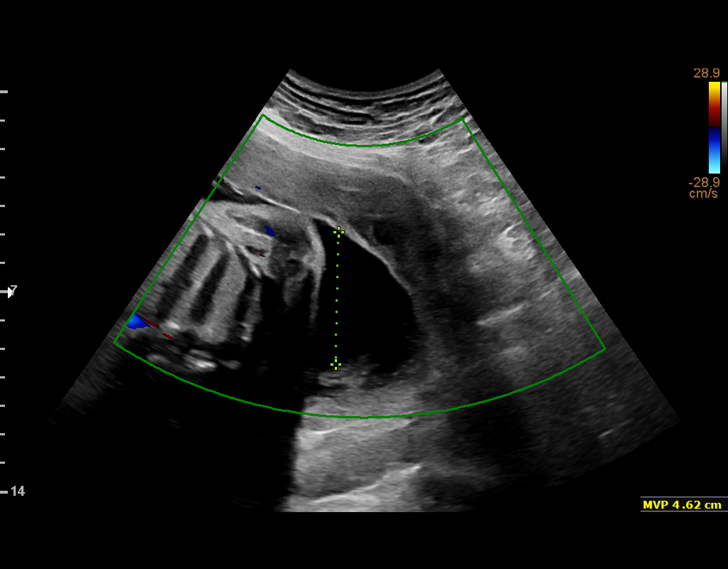
[im 25/40]
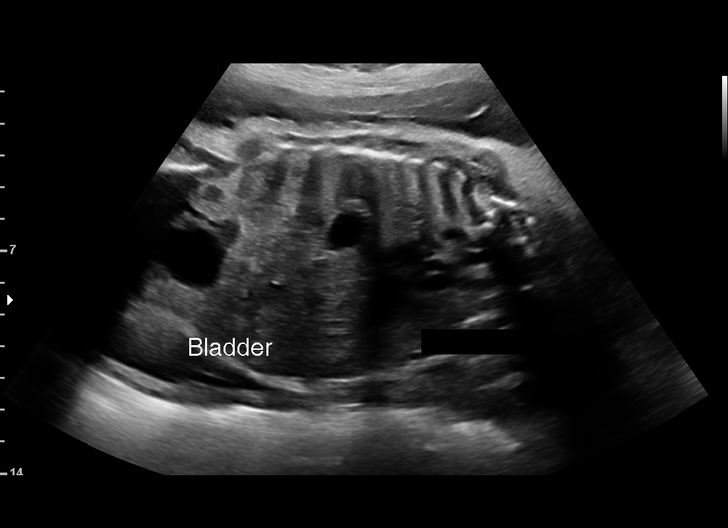
[im 28/40]
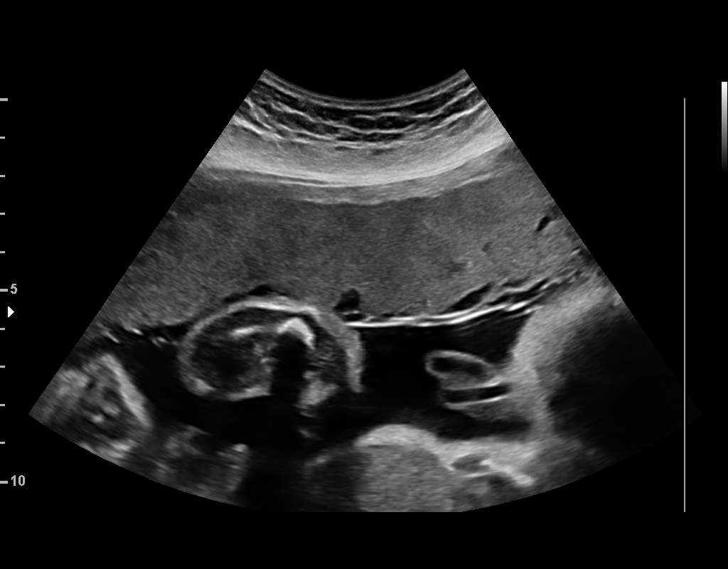
[im 31/40]
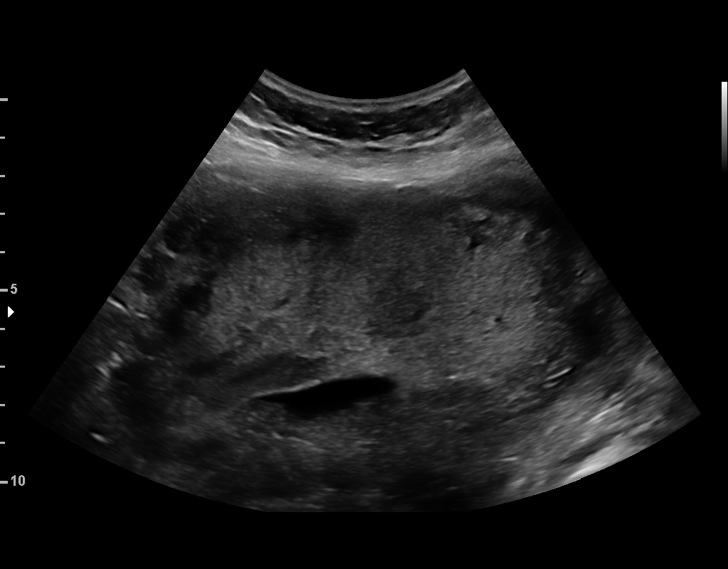
[im 34/40]
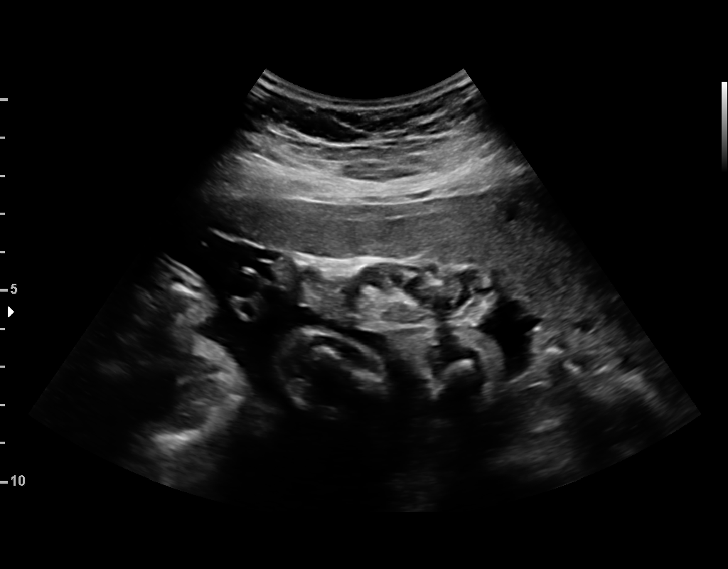
[im 37/40]
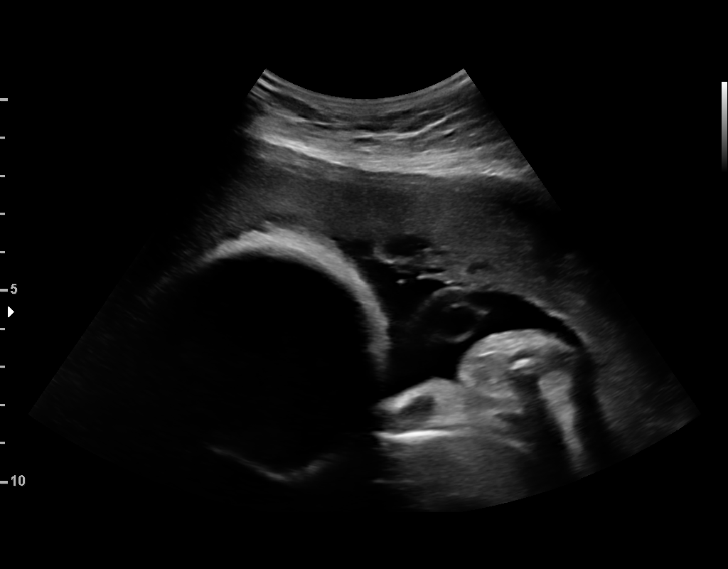
[im 40/40]
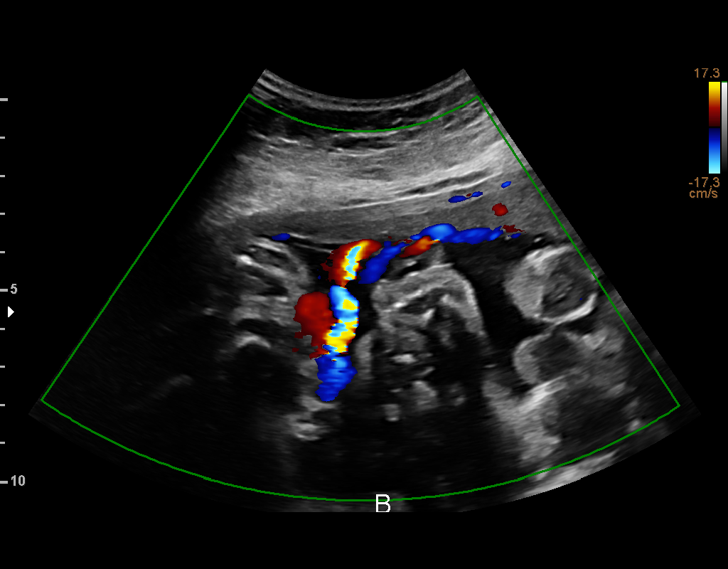

[14 of 28 positions shown; findings below may reference images not displayed]

[facility]

[facility] Clinic
Ref. Address:     Faculty

[facility]

Indications

32 weeks gestation of pregnancy
Twin pregnancy, [place], second trimester
OB History

Blood Type:            Height:  4'10"  Weight (lb):  163       BMI:
Gravidity:    3         Term:   2
Living:       2
Fetal Evaluation (Fetus A)

Num Of Fetuses:     2
Fetal Heart         135
Rate(bpm):
Cardiac Activity:   Observed
Fetal Lie:          Maternal left side
Presentation:       Cephalic
Placenta:           Anterior, above cervical os
P. Cord Insertion:  Visualized, central
Amniotic Fluid
AFI FV:      Subjectively within normal limits

Largest Pocket(cm)
4.62
Biophysical Evaluation (Fetus A)

Amniotic F.V:   Within normal limits       F. Tone:        Observed
F. Movement:    Observed                   Score:          [DATE]
F. Breathing:   Observed
Gestational Age (Fetus A)

LMP:           32w 3d        Date:  05/31/17                 EDD:   03/07/18
Best:          32w 3d     Det. By:  LMP  (05/31/17)          EDD:   03/07/18

Fetal Evaluation (Fetus B)

Num Of Fetuses:     2
Fetal Heart         161
Rate(bpm):
Cardiac Activity:   Observed
Fetal Lie:          Maternal right side
Presentation:       Cephalic
Placenta:           Anterior, above cervical os
P. Cord Insertion:  Marginal insertion

Amniotic Fluid
AFI FV:      Subjectively within normal limits

Largest Pocket(cm)
4.02
Biophysical Evaluation (Fetus B)

Amniotic F.V:   Within normal limits       F. Tone:        Observed
F. Movement:    Observed                   Score:          [DATE]
F. Breathing:   Observed
Gestational Age (Fetus B)

LMP:           32w 3d        Date:  05/31/17                 EDD:   03/07/18
Best:          32w 3d     Det. By:  LMP  (05/31/17)          EDD:   03/07/18
Impression

Monochorionic-diamniotic twin gestation at 32w 3d.
MVP A: 4.62, B: 4.02.
Stomach and bladder seen x 2.
BPP [DATE] x 2.
No evidence of TTTS.
Recommendations

Continue antenatal testing and serial ultrasound for fetal
growth as scheduled.
Recommend delivery between 34 and 37 weeks based on
maternal and fetal status.

## 2024-10-16 ENCOUNTER — Encounter (HOSPITAL_COMMUNITY): Payer: Self-pay

## 2024-10-16 ENCOUNTER — Emergency Department (HOSPITAL_COMMUNITY): Payer: Self-pay | Admitting: Certified Registered Nurse Anesthetist

## 2024-10-16 ENCOUNTER — Emergency Department (HOSPITAL_COMMUNITY): Payer: Self-pay

## 2024-10-16 ENCOUNTER — Inpatient Hospital Stay (HOSPITAL_COMMUNITY): Admission: EM | Admit: 2024-10-16 | Discharge: 2024-10-18 | DRG: 419 | Disposition: A | Payer: Self-pay

## 2024-10-16 ENCOUNTER — Other Ambulatory Visit: Payer: Self-pay

## 2024-10-16 ENCOUNTER — Encounter (HOSPITAL_COMMUNITY): Admission: EM | Disposition: A | Payer: Self-pay | Source: Home / Self Care

## 2024-10-16 DIAGNOSIS — K8000 Calculus of gallbladder with acute cholecystitis without obstruction: Principal | ICD-10-CM | POA: Diagnosis present

## 2024-10-16 DIAGNOSIS — K810 Acute cholecystitis: Secondary | ICD-10-CM

## 2024-10-16 DIAGNOSIS — Z603 Acculturation difficulty: Secondary | ICD-10-CM | POA: Diagnosis present

## 2024-10-16 DIAGNOSIS — K660 Peritoneal adhesions (postprocedural) (postinfection): Secondary | ICD-10-CM | POA: Diagnosis present

## 2024-10-16 LAB — CBC WITH DIFFERENTIAL/PLATELET
Abs Immature Granulocytes: 0.05 10*3/uL (ref 0.00–0.07)
Basophils Absolute: 0 10*3/uL (ref 0.0–0.1)
Basophils Relative: 0 %
Eosinophils Absolute: 0.1 10*3/uL (ref 0.0–0.5)
Eosinophils Relative: 0 %
HCT: 40.9 % (ref 36.0–46.0)
Hemoglobin: 13.7 g/dL (ref 12.0–15.0)
Immature Granulocytes: 0 %
Lymphocytes Relative: 13 %
Lymphs Abs: 2 10*3/uL (ref 0.7–4.0)
MCH: 29.4 pg (ref 26.0–34.0)
MCHC: 33.5 g/dL (ref 30.0–36.0)
MCV: 87.8 fL (ref 80.0–100.0)
Monocytes Absolute: 0.8 10*3/uL (ref 0.1–1.0)
Monocytes Relative: 5 %
Neutro Abs: 12.7 10*3/uL — ABNORMAL HIGH (ref 1.7–7.7)
Neutrophils Relative %: 82 %
Platelets: 372 10*3/uL (ref 150–400)
RBC: 4.66 MIL/uL (ref 3.87–5.11)
RDW: 12.4 % (ref 11.5–15.5)
WBC: 15.7 10*3/uL — ABNORMAL HIGH (ref 4.0–10.5)
nRBC: 0 % (ref 0.0–0.2)

## 2024-10-16 LAB — COMPREHENSIVE METABOLIC PANEL WITH GFR
ALT: 32 U/L (ref 0–44)
AST: 19 U/L (ref 15–41)
Albumin: 4.5 g/dL (ref 3.5–5.0)
Alkaline Phosphatase: 89 U/L (ref 38–126)
Anion gap: 12 (ref 5–15)
BUN: 9 mg/dL (ref 6–20)
CO2: 26 mmol/L (ref 22–32)
Calcium: 9.5 mg/dL (ref 8.9–10.3)
Chloride: 100 mmol/L (ref 98–111)
Creatinine, Ser: 0.73 mg/dL (ref 0.44–1.00)
GFR, Estimated: >60 mL/min
Glucose, Bld: 112 mg/dL — ABNORMAL HIGH (ref 70–99)
Potassium: 3.6 mmol/L (ref 3.5–5.1)
Sodium: 138 mmol/L (ref 135–145)
Total Bilirubin: 0.4 mg/dL (ref 0.0–1.2)
Total Protein: 7.8 g/dL (ref 6.5–8.1)

## 2024-10-16 LAB — URINALYSIS, ROUTINE W REFLEX MICROSCOPIC
Bilirubin Urine: NEGATIVE
Glucose, UA: NEGATIVE mg/dL
Ketones, ur: 80 mg/dL — AB
Nitrite: NEGATIVE
Protein, ur: NEGATIVE mg/dL
RBC / HPF: >50 RBC/hpf (ref 0–5)
Specific Gravity, Urine: 1.021 (ref 1.005–1.030)
pH: 6 (ref 5.0–8.0)

## 2024-10-16 LAB — LIPASE, BLOOD: Lipase: 21 U/L (ref 11–51)

## 2024-10-16 LAB — HCG, SERUM, QUALITATIVE: Preg, Serum: NEGATIVE

## 2024-10-16 MED ORDER — INDOCYANINE GREEN 25 MG IJ SOLR
1.2500 mg | Freq: Once | INTRAMUSCULAR | Status: DC
  Filled 2024-10-16: qty 10

## 2024-10-16 MED ORDER — ONDANSETRON HCL 4 MG/2ML IJ SOLN
INTRAMUSCULAR | Status: DC | PRN
  Administered 2024-10-16: 4 mg via INTRAVENOUS

## 2024-10-16 MED ORDER — ROCURONIUM BROMIDE 10 MG/ML (PF) SYRINGE
PREFILLED_SYRINGE | INTRAVENOUS | Status: AC
  Filled 2024-10-16: qty 10

## 2024-10-16 MED ORDER — DEXAMETHASONE SODIUM PHOSPHATE 4 MG/ML IJ SOLN
INTRAMUSCULAR | Status: DC | PRN
  Administered 2024-10-16: 5 mg via INTRAVENOUS

## 2024-10-16 MED ORDER — SCOPOLAMINE 1 MG/3DAYS TD PT72
MEDICATED_PATCH | TRANSDERMAL | Status: AC
  Filled 2024-10-16: qty 1

## 2024-10-16 MED ORDER — DIPHENHYDRAMINE HCL 50 MG/ML IJ SOLN: 25.0000 mg | Freq: Four times a day (QID) | INTRAMUSCULAR | Status: DC | PRN

## 2024-10-16 MED ORDER — HYDRALAZINE HCL 20 MG/ML IJ SOLN: 10.0000 mg | INTRAMUSCULAR | Status: DC | PRN

## 2024-10-16 MED ORDER — KETOROLAC TROMETHAMINE 30 MG/ML IJ SOLN: 30.0000 mg | Freq: Once | INTRAMUSCULAR | Status: DC | PRN

## 2024-10-16 MED ORDER — FENTANYL CITRATE (PF) 100 MCG/2ML IJ SOLN
INTRAMUSCULAR | Status: AC
  Filled 2024-10-16: qty 2

## 2024-10-16 MED ORDER — SCOPOLAMINE 1 MG/3DAYS TD PT72
1.0000 | MEDICATED_PATCH | TRANSDERMAL | Status: DC
  Administered 2024-10-16: 1 mg via TRANSDERMAL

## 2024-10-16 MED ORDER — SODIUM CHLORIDE 0.9 % IV BOLUS
1000.0000 mL | Freq: Once | INTRAVENOUS | Status: AC
  Administered 2024-10-16: 1000 mL via INTRAVENOUS

## 2024-10-16 MED ORDER — FENTANYL CITRATE (PF) 50 MCG/ML IJ SOSY: 25.0000 ug | PREFILLED_SYRINGE | INTRAMUSCULAR | Status: DC | PRN

## 2024-10-16 MED ORDER — LACTATED RINGERS IV SOLN: INTRAVENOUS | Status: DC | PRN

## 2024-10-16 MED ORDER — OXYCODONE HCL 5 MG/5ML PO SOLN: 5.0000 mg | Freq: Once | ORAL | Status: DC | PRN

## 2024-10-16 MED ORDER — BUPIVACAINE-EPINEPHRINE (PF) 0.25% -1:200000 IJ SOLN
INTRAMUSCULAR | Status: AC
  Filled 2024-10-16: qty 30

## 2024-10-16 MED ORDER — ACETAMINOPHEN 500 MG PO TABS
1000.0000 mg | ORAL_TABLET | Freq: Four times a day (QID) | ORAL | Status: DC
  Administered 2024-10-16 – 2024-10-18 (×7): 1000 mg via ORAL
  Filled 2024-10-16 (×7): qty 2

## 2024-10-16 MED ORDER — MORPHINE SULFATE (PF) 2 MG/ML IV SOLN
2.0000 mg | INTRAVENOUS | Status: AC | PRN
  Administered 2024-10-16 (×2): 2 mg via INTRAVENOUS
  Filled 2024-10-16 (×2): qty 1

## 2024-10-16 MED ORDER — HYDROMORPHONE HCL 1 MG/ML IJ SOLN: 0.5000 mg | INTRAMUSCULAR | Status: DC | PRN

## 2024-10-16 MED ORDER — ONDANSETRON 4 MG PO TBDP: 4.0000 mg | ORAL_TABLET | Freq: Four times a day (QID) | ORAL | Status: DC | PRN

## 2024-10-16 MED ORDER — CEFAZOLIN SODIUM-DEXTROSE 2-4 GM/100ML-% IV SOLN
INTRAVENOUS | Status: AC
  Filled 2024-10-16: qty 100

## 2024-10-16 MED ORDER — DOCUSATE SODIUM 100 MG PO CAPS
100.0000 mg | ORAL_CAPSULE | Freq: Two times a day (BID) | ORAL | Status: DC
  Administered 2024-10-16 – 2024-10-18 (×4): 100 mg via ORAL
  Filled 2024-10-16 (×4): qty 1

## 2024-10-16 MED ORDER — METHOCARBAMOL 500 MG PO TABS
500.0000 mg | ORAL_TABLET | Freq: Four times a day (QID) | ORAL | Status: DC | PRN
  Administered 2024-10-17 (×2): 500 mg via ORAL
  Filled 2024-10-16 (×2): qty 1

## 2024-10-16 MED ORDER — ACETAMINOPHEN 10 MG/ML IV SOLN
INTRAVENOUS | Status: AC
  Filled 2024-10-16: qty 100

## 2024-10-16 MED ORDER — BUPIVACAINE-EPINEPHRINE 0.25% -1:200000 IJ SOLN
INTRAMUSCULAR | Status: DC | PRN
  Administered 2024-10-16: 30 mL

## 2024-10-16 MED ORDER — GABAPENTIN 100 MG PO CAPS
300.0000 mg | ORAL_CAPSULE | Freq: Two times a day (BID) | ORAL | Status: DC
  Administered 2024-10-16 – 2024-10-18 (×4): 300 mg via ORAL
  Filled 2024-10-16 (×4): qty 3

## 2024-10-16 MED ORDER — METOPROLOL TARTRATE 5 MG/5ML IV SOLN: 5.0000 mg | Freq: Four times a day (QID) | INTRAVENOUS | Status: DC | PRN

## 2024-10-16 MED ORDER — MORPHINE SULFATE (PF) 4 MG/ML IV SOLN
4.0000 mg | Freq: Once | INTRAVENOUS | Status: AC
  Administered 2024-10-16: 4 mg via INTRAVENOUS
  Filled 2024-10-16: qty 1

## 2024-10-16 MED ORDER — 0.9 % SODIUM CHLORIDE (POUR BTL) OPTIME
TOPICAL | Status: DC | PRN
  Administered 2024-10-16: 1000 mL

## 2024-10-16 MED ORDER — PHENYLEPHRINE 80 MCG/ML (10ML) SYRINGE FOR IV PUSH (FOR BLOOD PRESSURE SUPPORT)
PREFILLED_SYRINGE | INTRAVENOUS | Status: DC | PRN
  Administered 2024-10-16: 120 ug via INTRAVENOUS

## 2024-10-16 MED ORDER — AMISULPRIDE (ANTIEMETIC) 5 MG/2ML IV SOLN: 10.0000 mg | Freq: Once | INTRAVENOUS | Status: DC | PRN

## 2024-10-16 MED ORDER — DEXAMETHASONE SOD PHOSPHATE PF 10 MG/ML IJ SOLN
INTRAMUSCULAR | Status: AC
  Filled 2024-10-16: qty 1

## 2024-10-16 MED ORDER — LIDOCAINE HCL (CARDIAC) PF 100 MG/5ML IV SOSY
PREFILLED_SYRINGE | INTRAVENOUS | Status: DC | PRN
  Administered 2024-10-16: 60 mg via INTRATRACHEAL

## 2024-10-16 MED ORDER — IOHEXOL 300 MG/ML  SOLN
100.0000 mL | Freq: Once | INTRAMUSCULAR | Status: AC | PRN
  Administered 2024-10-16: 100 mL via INTRAVENOUS

## 2024-10-16 MED ORDER — ACETAMINOPHEN 10 MG/ML IV SOLN
INTRAVENOUS | Status: DC | PRN
  Administered 2024-10-16: 1000 mg via INTRAVENOUS

## 2024-10-16 MED ORDER — ONDANSETRON HCL 4 MG/2ML IJ SOLN
4.0000 mg | Freq: Four times a day (QID) | INTRAMUSCULAR | Status: DC | PRN
  Administered 2024-10-16: 4 mg via INTRAVENOUS
  Filled 2024-10-16: qty 2

## 2024-10-16 MED ORDER — SIMETHICONE 80 MG PO CHEW: 40.0000 mg | CHEWABLE_TABLET | Freq: Four times a day (QID) | ORAL | Status: DC | PRN

## 2024-10-16 MED ORDER — MIDAZOLAM HCL 5 MG/5ML IJ SOLN
INTRAMUSCULAR | Status: DC | PRN
  Administered 2024-10-16: 2 mg via INTRAVENOUS

## 2024-10-16 MED ORDER — SODIUM CHLORIDE 0.9 % IV SOLN: INTRAVENOUS | Status: DC

## 2024-10-16 MED ORDER — TRAMADOL HCL 50 MG PO TABS: 50.0000 mg | ORAL_TABLET | Freq: Four times a day (QID) | ORAL | Status: DC | PRN

## 2024-10-16 MED ORDER — ROCURONIUM BROMIDE 10 MG/ML (PF) SYRINGE
PREFILLED_SYRINGE | INTRAVENOUS | Status: DC | PRN
  Administered 2024-10-16: 50 mg via INTRAVENOUS
  Administered 2024-10-16: 20 mg via INTRAVENOUS

## 2024-10-16 MED ORDER — MIDAZOLAM HCL 2 MG/2ML IJ SOLN
INTRAMUSCULAR | Status: AC
  Filled 2024-10-16: qty 2

## 2024-10-16 MED ORDER — DIPHENHYDRAMINE HCL 25 MG PO CAPS: 25.0000 mg | ORAL_CAPSULE | Freq: Four times a day (QID) | ORAL | Status: DC | PRN

## 2024-10-16 MED ORDER — SUGAMMADEX SODIUM 200 MG/2ML IV SOLN
INTRAVENOUS | Status: DC | PRN
  Administered 2024-10-16: 200 mg via INTRAVENOUS

## 2024-10-16 MED ORDER — LACTATED RINGERS IR SOLN
Status: DC | PRN
  Administered 2024-10-16: 1000 mL

## 2024-10-16 MED ORDER — ONDANSETRON HCL 4 MG/2ML IJ SOLN
INTRAMUSCULAR | Status: AC
  Filled 2024-10-16: qty 2

## 2024-10-16 MED ORDER — PROPOFOL 10 MG/ML IV BOLUS
INTRAVENOUS | Status: DC | PRN
  Administered 2024-10-16: 200 mg via INTRAVENOUS

## 2024-10-16 MED ORDER — LIDOCAINE HCL (PF) 2 % IJ SOLN
INTRAMUSCULAR | Status: AC
  Filled 2024-10-16: qty 5

## 2024-10-16 MED ORDER — SODIUM CHLORIDE 0.9 % IV SOLN
2.0000 g | Freq: Once | INTRAVENOUS | Status: AC
  Administered 2024-10-16: 2 g via INTRAVENOUS
  Filled 2024-10-16: qty 20

## 2024-10-16 MED ORDER — SUGAMMADEX SODIUM 200 MG/2ML IV SOLN
INTRAVENOUS | Status: AC
  Filled 2024-10-16: qty 2

## 2024-10-16 MED ORDER — CEFAZOLIN SODIUM-DEXTROSE 2-3 GM-%(50ML) IV SOLR
INTRAVENOUS | Status: DC | PRN
  Administered 2024-10-16: 2 g via INTRAVENOUS

## 2024-10-16 MED ORDER — PHENYLEPHRINE 80 MCG/ML (10ML) SYRINGE FOR IV PUSH (FOR BLOOD PRESSURE SUPPORT)
PREFILLED_SYRINGE | INTRAVENOUS | Status: AC
  Filled 2024-10-16: qty 10

## 2024-10-16 MED ORDER — BISACODYL 10 MG RE SUPP: 10.0000 mg | Freq: Every day | RECTAL | Status: DC | PRN

## 2024-10-16 MED ORDER — OXYCODONE HCL 5 MG PO TABS
5.0000 mg | ORAL_TABLET | Freq: Four times a day (QID) | ORAL | Status: DC | PRN
  Administered 2024-10-16 – 2024-10-17 (×3): 5 mg via ORAL
  Filled 2024-10-16 (×4): qty 1

## 2024-10-16 MED ORDER — OXYCODONE HCL 5 MG PO TABS: 5.0000 mg | ORAL_TABLET | Freq: Once | ORAL | Status: DC | PRN

## 2024-10-16 MED ORDER — ACETAMINOPHEN 10 MG/ML IV SOLN: 1000.0000 mg | Freq: Once | INTRAVENOUS | Status: DC | PRN

## 2024-10-16 MED ORDER — STERILE WATER FOR INJECTION IJ SOLN
INTRAMUSCULAR | Status: DC | PRN
  Administered 2024-10-16: 1 mL via INTRAVENOUS

## 2024-10-16 MED ORDER — ONDANSETRON HCL 4 MG/2ML IJ SOLN
4.0000 mg | Freq: Once | INTRAMUSCULAR | Status: AC
  Administered 2024-10-16: 4 mg via INTRAVENOUS
  Filled 2024-10-16: qty 2

## 2024-10-16 MED ORDER — ONDANSETRON HCL 4 MG/2ML IJ SOLN: 4.0000 mg | Freq: Four times a day (QID) | INTRAMUSCULAR | Status: DC | PRN

## 2024-10-16 MED ORDER — KETOROLAC TROMETHAMINE 30 MG/ML IJ SOLN
INTRAMUSCULAR | Status: DC | PRN
  Administered 2024-10-16: 30 mg via INTRAVENOUS

## 2024-10-16 MED ORDER — ENOXAPARIN SODIUM 30 MG/0.3ML IJ SOSY
30.0000 mg | PREFILLED_SYRINGE | INTRAMUSCULAR | Status: DC
  Administered 2024-10-17 – 2024-10-18 (×2): 30 mg via SUBCUTANEOUS
  Filled 2024-10-16 (×2): qty 0.3

## 2024-10-16 MED ORDER — FENTANYL CITRATE (PF) 100 MCG/2ML IJ SOLN
INTRAMUSCULAR | Status: DC | PRN
  Administered 2024-10-16: 100 ug via INTRAVENOUS
  Administered 2024-10-16: 50 ug via INTRAVENOUS

## 2024-10-16 MED ORDER — KETOROLAC TROMETHAMINE 30 MG/ML IJ SOLN
INTRAMUSCULAR | Status: AC
  Filled 2024-10-16: qty 1

## 2024-10-16 NOTE — Anesthesia Procedure Notes (Signed)
 Procedure Name: Intubation Date/Time: 10/16/2024 10:09 AM  Performed by: Judythe Tanda Aran, CRNAPre-anesthesia Checklist: Patient identified, Emergency Drugs available, Suction available and Patient being monitored Patient Re-evaluated:Patient Re-evaluated prior to induction Oxygen Delivery Method: Circle system utilized Preoxygenation: Pre-oxygenation with 100% oxygen Induction Type: IV induction Ventilation: Mask ventilation without difficulty Laryngoscope Size: 2 and Miller Grade View: Grade I Tube type: Oral Tube size: 7.0 mm Number of attempts: 1 Airway Equipment and Method: Stylet Placement Confirmation: ETT inserted through vocal cords under direct vision, positive ETCO2 and breath sounds checked- equal and bilateral Secured at: 22 cm Tube secured with: Tape Dental Injury: Teeth and Oropharynx as per pre-operative assessment

## 2024-10-16 NOTE — Anesthesia Preprocedure Evaluation (Addendum)
"                                    Anesthesia Evaluation  Patient identified by MRN, date of birth, ID band Patient awake    Reviewed: Allergy & Precautions, NPO status , Patient's Chart, lab work & pertinent test results  Airway Mallampati: II  TM Distance: >3 FB Neck ROM: Full    Dental no notable dental hx.    Pulmonary neg pulmonary ROS   Pulmonary exam normal        Cardiovascular negative cardio ROS Normal cardiovascular exam     Neuro/Psych negative neurological ROS  negative psych ROS   GI/Hepatic negative GI ROS, Neg liver ROS,,,  Endo/Other  negative endocrine ROS    Renal/GU negative Renal ROS     Musculoskeletal negative musculoskeletal ROS (+)    Abdominal  (+) + obese  Peds  Hematology negative hematology ROS (+)   Anesthesia Other Findings CHOLECYSITIS  Reproductive/Obstetrics Hcg negative                              Anesthesia Physical Anesthesia Plan  ASA: 1  Anesthesia Plan: General   Post-op Pain Management:    Induction: Intravenous  PONV Risk Score and Plan: 4 or greater and Ondansetron , Dexamethasone , Midazolam , Scopolamine  patch - Pre-op and Treatment may vary due to age or medical condition  Airway Management Planned: Oral ETT  Additional Equipment:   Intra-op Plan:   Post-operative Plan: Extubation in OR  Informed Consent: I have reviewed the patients History and Physical, chart, labs and discussed the procedure including the risks, benefits and alternatives for the proposed anesthesia with the patient or authorized representative who has indicated his/her understanding and acceptance.     Dental advisory given and Interpreter used for interview  Plan Discussed with: CRNA  Anesthesia Plan Comments:          Anesthesia Quick Evaluation  "

## 2024-10-16 NOTE — Anesthesia Postprocedure Evaluation (Signed)
"   Anesthesia Post Note  Patient: Angel Swanson, Angel Swanson  Procedure(s) Performed: LAPAROSCOPIC CHOLECYSTECTOMY (Abdomen)     Patient location during evaluation: PACU Anesthesia Type: General Level of consciousness: awake Pain management: pain level controlled Vital Signs Assessment: post-procedure vital signs reviewed and stable Respiratory status: spontaneous breathing, nonlabored ventilation and respiratory function stable Cardiovascular status: blood pressure returned to baseline and stable Postop Assessment: no apparent nausea or vomiting Anesthetic complications: no   No notable events documented.  Last Vitals:  Vitals:   10/16/24 1342 10/16/24 1455  BP: (!) 143/89 (!) 142/80  Pulse: (!) 59 62  Resp: 16 15  Temp: (!) 36.3 C 36.7 C  SpO2: 100% 100%    Last Pain:  Vitals:   10/16/24 1455  TempSrc: Oral  PainSc:                  Angel Swanson      "

## 2024-10-16 NOTE — ED Triage Notes (Signed)
 Pt reports generalized abdominal pain x 4 days that she describes as constant and burning. Pain is worse with eating. Pt also reports constipation and vomiting, although last BM was at 2000 tonight

## 2024-10-16 NOTE — Op Note (Signed)
 Operative Note  Angel Swanson  [id number]  [id number]  10/16/2024   Surgeon: Mitzie Freund MD FACS   Assistant: Camellia Blush MD FACS   Procedure performed: Laparoscopic cholecystectomy with near infrared fluorescent angiography   Preop diagnosis: Acute cholecystitis Post-op diagnosis/intraop findings: Same, severe scarring and cicatrix along the cystic duct which was intimately adherent to the common bile duct.     Specimens: Gallbladder Retained items: 22 French round Blake drain in the gallbladder fossa EBL: 20cc Complications: none   Description of procedure: After obtaining informed consent the patient was taken to the operating room and placed supine on operating room table where general endotracheal anesthesia was initiated, preoperative antibiotics were administered, SCDs applied, and a formal timeout was performed.  The abdomen was prepped and draped in usual sterile fashion.  Peritoneal access was gained using infraumbilical Veress needle followed by insufflation to 15 mmHg.  A 5 mm trocar and camera were then introduced in the abdominal cavity surveyed and confirmed to be free of any injury.  The patient was then positioned in reverse Trendelenburg with left tilt and additional trocars were placed under direct visualization after infiltration with local.  Omental adhesions to the gallbladder were gently swept away.  The gallbladder was noted to be edematous, erythematous and tensely distended.  This was decompressed with a Nezhat in order to allow it to be grasped.  The gallbladder fundus was then retracted cephalad and continued blunt dissection with sparse the cystic electrocautery was used to clear additional omental adhesions to the gallbladder body and infundibulum.  As we approach the gallbladder neck and cystic duct region however there was dense firm scar and this was noted to be tethering the common bile duct and porta towards the gallbladder.  Near infrared fluorescent  cholangiography was activated and this did illuminate the common bile duct which appeared to be well away from the area of dissection however the cystic duct and gallbladder did not illuminate consistent with true cholecystitis.   We continued carefully dissecting using the  hook and blunt suction dissection to debride thin, transparent strands of tissue.  As we progressed anteriorly and posteriorly around the gallbladder neck, we noted a stone impacted at the gallbladder neck.  There was a large vessel likely the right hepatic artery coursing along the posterior aspect of the gallbladder neck and this was tented up towards the gallbladder due to the cicatrix process in this area.  We were able to carefully dissect this large vessel away however in doing so, I did encounter bile leak from a small tubular structure which appeared to be running parallel to the right side of the common bile duct.  We continued to inspect intermittently using the near infrared fluorescent cholangiography.  Ultimately we proceeded to take the remainder of the gallbladder off of the liver bed using cautery and blunt dissection.  We placed 2-0 PDS Endoloops at the infundibulum-gallbladder neck junction where the cicatrix reaction seem to begin; this was well away from the common bile duct but did leave a residual stone in the gallbladder neck.  The gallbladder was then amputated and removed with an Endo Catch bag through the epigastric trocar site.  At this point we are better able to inspect the area of the bile leakage.  Gentle blunt dissection was able to develop a plane around this small tubular structure and we were able to place a clip proximally and distally.  This structure was certainly less than 5 mm in diameter but was immediately  adjacent to and parallel to the right posterior side of the common hepatic duct.  Suspect that this was the cystic duct which has an aberrant/ distored course secondary to the dense fibrotic  inflammatory process.  On completion there did not appear to be any ongoing bile leak.  The right upper quadrant was irrigated and the effluent was clear.  Hemostasis was confirmed.  A 19 French round Blake drain was inserted through the right lateral trocar and directed into the hepatic fossa and secured to the skin with 2-0 nylon.  The 12 mm trocar site was closed with a 0 Vicryl in the fascia using laparoscopic suture passer under direct visualization.  The abdomen was then desufflated and the remaining trocars removed.  The skin was closed with subcuticular 4-0 monocryl and Dermabond.  The patient was then awakened, extubated and taken to PACU in stable condition.    All counts were correct at the completion of the case.

## 2024-10-16 NOTE — ED Provider Notes (Signed)
 " [place] [facility] AT [facility] Provider Note   CSN: [id number] Arrival date & time: 10/16/24  9967     Patient presents with: Abdominal Pain   Angel Swanson is a 34 y.o. female.   34 year old female presents with complaint of abdominal pain x 3-4 days, intermittent, upper abdomen, radiates towards back on both sides. Associated with nausea, vomiting, with fever and chills (has not measured temperature). Feels constipated, last bowel movement today around 1pm (small, non bloody). No changes in bladder habits. No similar pain previously. Pain is worse with eating.  Prior abdominal surgeries- none Not breastfeeding   A language interpreter was used (#[id number]).  Abdominal Pain      Prior to Admission medications  Medication Sig Start Date End Date Taking? Authorizing Provider  diclofenac (CATAFLAM) 50 MG tablet Take 50 mg by mouth 2 (two) times daily as needed. Patient not taking: Reported on 10/16/2024 09/26/24   [provider]  ibuprofen  (ADVIL ,MOTRIN ) 600 MG tablet Take 1 tablet (600 mg total) by mouth every 6 (six) hours as needed. Patient not taking: Reported on 10/16/2024 02/15/18   Garnell Pieter DASEN, MD  methocarbamol  (ROBAXIN ) 500 MG tablet Take 500 mg by mouth 3 (three) times daily. Patient not taking: Reported on 10/16/2024 09/25/24   [provider]  omeprazole (PRILOSEC) 40 MG capsule Take 40 mg by mouth every morning. Patient not taking: Reported on 10/16/2024 09/25/24   [provider]  prenatal vitamin w/FE, FA (PRENATAL 1 + 1) 27-1 MG TABS tablet Take 1 tablet by mouth daily at 12 noon. Patient not taking: Reported on 10/16/2024    [provider]    Allergies: Patient has no known allergies.    Review of Systems  Gastrointestinal:  Positive for abdominal pain.  Negative except as per HPI  Updated Vital Signs BP 136/81 (BP Location: Left Arm)   Pulse (!) 55   Temp 98.4 F (36.9 C) (Oral)   Resp 17    LMP 09/30/2024 (Exact Date)   SpO2 99%   Physical Exam Vitals and nursing note reviewed.  Constitutional:      General: She is not in acute distress.    Appearance: She is well-developed. She is not diaphoretic.  HENT:     Head: Normocephalic and atraumatic.  Pulmonary:     Effort: Pulmonary effort is normal.  Abdominal:     Palpations: Abdomen is soft.     Tenderness: There is generalized abdominal tenderness.  Skin:    General: Skin is warm and dry.     Findings: No erythema or rash.  Neurological:     Mental Status: She is alert and oriented to person, place, and time.  Psychiatric:        Behavior: Behavior normal.     (all labs ordered are listed, but only abnormal results are displayed) Labs Reviewed  COMPREHENSIVE METABOLIC PANEL WITH GFR - Abnormal; Notable for the following components:      Result Value   Glucose, Bld 112 (*)    All other components within normal limits  CBC WITH DIFFERENTIAL/PLATELET - Abnormal; Notable for the following components:   WBC 15.7 (*)    Neutro Abs 12.7 (*)    All other components within normal limits  URINALYSIS, ROUTINE W REFLEX MICROSCOPIC - Abnormal; Notable for the following components:   APPearance HAZY (*)    Hgb urine dipstick LARGE (*)    Ketones, ur 80 (*)    Leukocytes,Ua LARGE (*)  Bacteria, UA RARE (*)    All other components within normal limits  LIPASE, BLOOD  HCG, SERUM, QUALITATIVE    EKG: None  Radiology: CT ABDOMEN PELVIS W CONTRAST Result Date: 10/16/2024 EXAM: CT ABDOMEN AND PELVIS WITH CONTRAST 10/16/2024 02:34:04 AM TECHNIQUE: CT of the abdomen and pelvis was performed with the administration of 100 mL of iohexol  (OMNIPAQUE ) 300 MG/ML solution. Multiplanar reformatted images are provided for review. Automated exposure control, iterative reconstruction, and/or weight-based adjustment of the mA/kV was utilized to reduce the radiation dose to as low as reasonably achievable. COMPARISON: None available.  CLINICAL HISTORY: Acute, nonlocalized abdominal pain. Constipation, vomiting. FINDINGS: LOWER CHEST: No acute abnormality. LIVER: Mild hepatic steatosis. Hyperemia within the adjacent hepatic parenchyma in keeping with changes of acute cholecystitis. GALLBLADDER AND BILE DUCTS: The gallbladder is distended. There is mild pericholecystic inflammatory stranding and hyperemia within the adjacent hepatic parenchyma in keeping with changes of acute cholecystitis. No biliary ductal dilatation. SPLEEN: No acute abnormality. PANCREAS: No acute abnormality. ADRENAL GLANDS: No acute abnormality. KIDNEYS, URETERS AND BLADDER: Tiny simple cortical cyst within the right kidney for which follow-up imaging is recommended. Per consensus, no follow-up is needed for simple Bosniak type 1 and 2 renal cysts, unless the patient has a malignancy history or risk factors. No stones in the kidneys or ureters. No hydronephrosis. No perinephric or periureteral stranding. Urinary bladder is unremarkable. GI AND BOWEL: The stomach, small bowel, and large bowel are otherwise unremarkable. Appendix is normal. There is no bowel obstruction. PERITONEUM AND RETROPERITONEUM: No ascites. No free air. VASCULATURE: Aorta is normal in caliber. LYMPH NODES: No lymphadenopathy. REPRODUCTIVE ORGANS: No acute abnormality. BONES AND SOFT TISSUES: No acute osseous abnormality. No focal soft tissue abnormality. IMPRESSION: 1. Acute cholecystitis with distended gallbladder, mild pericholecystic inflammatory stranding, and hyperemia within the adjacent hepatic parenchyma. 2. Mild hepatic steatosis. Electronically signed by: Dorethia Molt MD 10/16/2024 02:58 AM EST RP Workstation: [id number]     Procedures   Medications Ordered in the ED  morphine  (PF) 2 MG/ML injection 2 mg (2 mg Intravenous Given 10/16/24 0426)  ondansetron  (ZOFRAN ) injection 4 mg (4 mg Intravenous Given 10/16/24 0426)  sodium chloride  0.9 % bolus 1,000 mL (0 mLs Intravenous Stopped  10/16/24 0541)  morphine  (PF) 4 MG/ML injection 4 mg (4 mg Intravenous Given 10/16/24 0208)  ondansetron  (ZOFRAN ) injection 4 mg (4 mg Intravenous Given 10/16/24 0208)  iohexol  (OMNIPAQUE ) 300 MG/ML solution 100 mL (100 mLs Intravenous Contrast Given 10/16/24 0221)  cefTRIAXone  (ROCEPHIN ) 2 g in sodium chloride  0.9 % 100 mL IVPB (0 g Intravenous Stopped 10/16/24 0341)                                    Medical Decision Making Amount and/or Complexity of Data Reviewed Labs: ordered. Radiology: ordered.  Risk Prescription drug management. Decision regarding hospitalization.   This patient presents to the ED for concern of abdominal pain, nausea, vomiting, this involves an extensive number of treatment options, and is a complaint that carries with it a high risk of complications and morbidity.  The differential diagnosis includes but not limited to gastritis, peptic ulcer disease, GERD, acute cholecystitis, pancreatitis   Co morbidities / Chronic conditions that complicate the patient evaluation  No significant past medical history's.  No prior abdominal surgeries.   Additional history obtained:  Additional history obtained from EMR External records from outside source obtained and reviewed including prior labs on  file   Lab Tests:  I Ordered, and personally interpreted labs.  The pertinent results include: hCG negative.  Urinalysis contaminated, does have hemoglobin, ketones, leukocytes.  CBC with leukocytosis of white count of 15.7 increased neutrophils.  CMP without significant findings, specifically LFTs, alk phos, bili normal.  Lipase normal.   Imaging Studies ordered:  I ordered imaging studies including CT abdomen pelvis  I independently visualized and interpreted imaging which showed acute cholecystitis I agree with the radiologist interpretation   Problem List / ED Course / Critical interventions / Medication management  34 year old female presents with complaint of  upper abdominal pain with nausea and vomiting for the past 3 to 4 days with chills.  Found have generalized abdominal tenderness.  Provided with pain medications and antiemetics.  Pain persists.  Labs reveal mild leukocytosis, chemistry unremarkable.  CT suggesting acute cholecystitis.  On recheck, pain more localized to right upper quadrant.  Discussed results and plan of care with patient with interpreter.  Remain n.p.o., general surgery team to see. Ultrasound ordered.  Patient may need to be transferred to [facility] today for ultrasound if unavailable at this facility.  Care signed out to oncoming provider at change of shift pending evaluation of surgery and ultrasound with possibility of transfer. I ordered medication including morphine , zofran , rocephin    Reevaluation of the patient after these medicines showed that the patient pain persists, additional pain meds ordered I have reviewed the patients home medicines and have made adjustments as needed   Consultations Obtained:  I requested consultation with the general surgery team- Dr. Tanda,  and discussed lab and imaging findings as well as pertinent plan - they recommend: surgery will see patient, request US .    Social Determinants of Health:  Lives with family   Test / Admission - Considered:  admit      Final diagnoses:  Acute cholecystitis    ED Discharge Orders     None          Angel Swanson 10/16/24 0630    Palumbo, April, MD 10/16/24 [phone]  "

## 2024-10-16 NOTE — H&P (Signed)
 Surgical Evaluation Chief Complaint: Abdominal pain  HPI: This is a 34 year-old woman with no known medical problems who presented to the emergency department overnight with a 3-day history of intermittent upper abdominal pain radiating towards the back on both sides.  Reports associated nausea, vomiting, fever and chills (subjective).  She has not had prior similar episodes.  Pain is aggravated by eating.  This has progressively worsened.  She denies any previous abdominal surgery.  Does not smoke, drink or use any drugs.  She works as a public affairs consultant.  Allergies[1]  Past Medical History:  Diagnosis Date   Medical history non-contributory     Past Surgical History:  Procedure Laterality Date   NO PAST SURGERIES      No family history on file.  Social History   Socioeconomic History   Marital status: Married    Spouse name: Not on file   Number of children: Not on file   Years of education: Not on file   Highest education level: Not on file  Occupational History   Not on file  Tobacco Use   Smoking status: Never   Smokeless tobacco: Never  Substance and Sexual Activity   Alcohol use: No   Drug use: No   Sexual activity: Not Currently    Birth control/protection: None    Comment: nexplenon  Other Topics Concern   Not on file  Social History Narrative   Not on file   Social Drivers of Health   Tobacco Use: Not on file  Financial Resource Strain: Not on file  Food Insecurity: Not on file  Transportation Needs: Not on file  Physical Activity: Not on file  Stress: Not on file  Social Connections: Not on file  Depression (PHQ2-9): Not on file  Alcohol Screen: Not on file  Housing: Not on file  Utilities: Not on file  Health Literacy: Not on file    Medications Ordered Prior to Encounter[2]  Review of Systems: a complete, 10pt review of systems was completed with pertinent positives and negatives as documented in the HPI  Physical Exam: Vitals:   10/16/24 0458  10/16/24 0830  BP: 136/81 (!) 152/74  Pulse: (!) 55 (!) 55  Resp: 17   Temp: 98.4 F (36.9 C)   SpO2: 99% 94%   Gen: A&Ox3, no distress  Chest: respiratory effort is normal.  Cardiovascular: RRR Gastrointestinal: soft, nondistended, tender with voluntary guarding in the epigastrium and right upper quadrant Neuro: No gross deficit Psych: appropriate mood and affect, normal insight/judgment intact  Skin: warm and dry      Latest Ref Rng & Units 10/16/2024    1:10 AM 02/13/2018    9:29 PM 12/26/2017    9:52 AM  CBC  WBC 4.0 - 10.5 K/uL 15.7  17.8  10.4   Hemoglobin 12.0 - 15.0 g/dL 86.2  85.3  87.6   Hematocrit 36.0 - 46.0 % 40.9  44.5  35.5   Platelets 150 - 400 K/uL 372  281  307        Latest Ref Rng & Units 10/16/2024    1:10 AM  CMP  Glucose 70 - 99 mg/dL 887   BUN 6 - 20 mg/dL 9   Creatinine 9.55 - 8.99 mg/dL 9.26   Sodium 864 - 854 mmol/L 138   Potassium 3.5 - 5.1 mmol/L 3.6   Chloride 98 - 111 mmol/L 100   CO2 22 - 32 mmol/L 26   Calcium 8.9 - 10.3 mg/dL 9.5   Total  Protein 6.5 - 8.1 g/dL 7.8   Total Bilirubin 0.0 - 1.2 mg/dL 0.4   Alkaline Phos 38 - 126 U/L 89   AST 15 - 41 U/L 19   ALT 0 - 44 U/L 32     No results found for: INR, PROTIME  Imaging: CT ABDOMEN PELVIS W CONTRAST Result Date: 10/16/2024 EXAM: CT ABDOMEN AND PELVIS WITH CONTRAST 10/16/2024 02:34:04 AM TECHNIQUE: CT of the abdomen and pelvis was performed with the administration of 100 mL of iohexol  (OMNIPAQUE ) 300 MG/ML solution. Multiplanar reformatted images are provided for review. Automated exposure control, iterative reconstruction, and/or weight-based adjustment of the mA/kV was utilized to reduce the radiation dose to as low as reasonably achievable. COMPARISON: None available. CLINICAL HISTORY: Acute, nonlocalized abdominal pain. Constipation, vomiting. FINDINGS: LOWER CHEST: No acute abnormality. LIVER: Mild hepatic steatosis. Hyperemia within the adjacent hepatic parenchyma in keeping  with changes of acute cholecystitis. GALLBLADDER AND BILE DUCTS: The gallbladder is distended. There is mild pericholecystic inflammatory stranding and hyperemia within the adjacent hepatic parenchyma in keeping with changes of acute cholecystitis. No biliary ductal dilatation. SPLEEN: No acute abnormality. PANCREAS: No acute abnormality. ADRENAL GLANDS: No acute abnormality. KIDNEYS, URETERS AND BLADDER: Tiny simple cortical cyst within the right kidney for which follow-up imaging is recommended. Per consensus, no follow-up is needed for simple Bosniak type 1 and 2 renal cysts, unless the patient has a malignancy history or risk factors. No stones in the kidneys or ureters. No hydronephrosis. No perinephric or periureteral stranding. Urinary bladder is unremarkable. GI AND BOWEL: The stomach, small bowel, and large bowel are otherwise unremarkable. Appendix is normal. There is no bowel obstruction. PERITONEUM AND RETROPERITONEUM: No ascites. No free air. VASCULATURE: Aorta is normal in caliber. LYMPH NODES: No lymphadenopathy. REPRODUCTIVE ORGANS: No acute abnormality. BONES AND SOFT TISSUES: No acute osseous abnormality. No focal soft tissue abnormality. IMPRESSION: 1. Acute cholecystitis with distended gallbladder, mild pericholecystic inflammatory stranding, and hyperemia within the adjacent hepatic parenchyma. 2. Mild hepatic steatosis. Electronically signed by: Dorethia Molt MD 10/16/2024 02:58 AM EST RP Workstation: [id number]     A/P: Acute cholecystitis.  I have reviewed her CT images personally.  Her clinical history and exam are consistent with cholecystitis as well. I recommend proceeding with laparoscopic cholecystectomy with possible cholangiogram. Discussed relevant anatomy, surgery technique, and risks of surgery including bleeding, infection, pain, scarring, intraabdominal injury specifically to the common bile duct and sequelae, bile leak, conversion to open surgery, subtotal cholecystectomy,  blood clot, pneumonia, heart attack, stroke, failure to resolve symptoms, etc. Questions welcomed and answered.  Patient wishes to proceed with surgery.    Patient Active Problem List   Diagnosis Date Noted   Language barrier 12/08/2017  This encounter was completed with a video Spanish interpreter.    Mitzie Freund, MD [facility] Surgery  See AMION to contact appropriate on-call provider      [1] No Known Allergies [2]  No current facility-administered medications on file prior to encounter.   Current Outpatient Medications on File Prior to Encounter  Medication Sig Dispense Refill   diclofenac (CATAFLAM) 50 MG tablet Take 50 mg by mouth 2 (two) times daily as needed. (Patient not taking: Reported on 10/16/2024)     ibuprofen  (ADVIL ,MOTRIN ) 600 MG tablet Take 1 tablet (600 mg total) by mouth every 6 (six) hours as needed. (Patient not taking: Reported on 10/16/2024) 30 tablet 0   methocarbamol  (ROBAXIN ) 500 MG tablet Take 500 mg by mouth 3 (three) times daily. (Patient not taking:  Reported on 10/16/2024)     omeprazole (PRILOSEC) 40 MG capsule Take 40 mg by mouth every morning. (Patient not taking: Reported on 10/16/2024)     prenatal vitamin w/FE, FA (PRENATAL 1 + 1) 27-1 MG TABS tablet Take 1 tablet by mouth daily at 12 noon. (Patient not taking: Reported on 10/16/2024)

## 2024-10-16 NOTE — Transfer of Care (Signed)
 Immediate Anesthesia Transfer of Care Note  Patient: Angel Swanson  Procedure(s) Performed: LAPAROSCOPIC CHOLECYSTECTOMY (Abdomen)  Patient Location: PACU  Anesthesia Type:General  Level of Consciousness: awake  Airway & Oxygen Therapy: Patient Spontanous Breathing and Patient connected to face mask  Post-op Assessment: Report given to RN and Post -op Vital signs reviewed and stable  Post vital signs: Reviewed and stable  Last Vitals:  Vitals Value Taken Time  BP    Temp    Pulse 91 10/16/24 12:18  Resp 17 10/16/24 12:18  SpO2 100 % 10/16/24 12:18  Vitals shown include unfiled device data.  Last Pain:  Vitals:   10/16/24 0950  TempSrc:   PainSc: 10-Worst pain ever      Patients Stated Pain Goal: 0 (10/16/24 0049)  Complications: No notable events documented.

## 2024-10-17 ENCOUNTER — Encounter (HOSPITAL_COMMUNITY): Payer: Self-pay | Admitting: Surgery

## 2024-10-17 LAB — CBC
HCT: 35.5 % — ABNORMAL LOW (ref 36.0–46.0)
Hemoglobin: 11.7 g/dL — ABNORMAL LOW (ref 12.0–15.0)
MCH: 29.3 pg (ref 26.0–34.0)
MCHC: 33 g/dL (ref 30.0–36.0)
MCV: 89 fL (ref 80.0–100.0)
Platelets: 257 10*3/uL (ref 150–400)
RBC: 3.99 MIL/uL (ref 3.87–5.11)
RDW: 12.6 % (ref 11.5–15.5)
WBC: 14.2 10*3/uL — ABNORMAL HIGH (ref 4.0–10.5)
nRBC: 0 % (ref 0.0–0.2)

## 2024-10-17 LAB — COMPREHENSIVE METABOLIC PANEL WITH GFR
ALT: 70 U/L — ABNORMAL HIGH (ref 0–44)
AST: 56 U/L — ABNORMAL HIGH (ref 15–41)
Albumin: 3.4 g/dL — ABNORMAL LOW (ref 3.5–5.0)
Alkaline Phosphatase: 93 U/L (ref 38–126)
Anion gap: 8 (ref 5–15)
BUN: 8 mg/dL (ref 6–20)
CO2: 24 mmol/L (ref 22–32)
Calcium: 8.5 mg/dL — ABNORMAL LOW (ref 8.9–10.3)
Chloride: 105 mmol/L (ref 98–111)
Creatinine, Ser: 0.72 mg/dL (ref 0.44–1.00)
GFR, Estimated: >60 mL/min
Glucose, Bld: 96 mg/dL (ref 70–99)
Potassium: 3.6 mmol/L (ref 3.5–5.1)
Sodium: 137 mmol/L (ref 135–145)
Total Bilirubin: 0.8 mg/dL (ref 0.0–1.2)
Total Protein: 6 g/dL — ABNORMAL LOW (ref 6.5–8.1)

## 2024-10-17 LAB — MAGNESIUM: Magnesium: 2.1 mg/dL (ref 1.7–2.4)

## 2024-10-17 MED ORDER — POTASSIUM CHLORIDE 20 MEQ PO PACK
40.0000 meq | PACK | Freq: Once | ORAL | Status: AC
  Administered 2024-10-17: 40 meq via ORAL
  Filled 2024-10-17: qty 2

## 2024-10-17 NOTE — Plan of Care (Signed)
  Problem: Activity: Goal: Risk for activity intolerance will decrease Outcome: Progressing   Problem: Elimination: Goal: Will not experience complications related to bowel motility Outcome: Progressing   Problem: Pain Managment: Goal: General experience of comfort will improve and/or be controlled Outcome: Progressing

## 2024-10-17 NOTE — Progress Notes (Signed)
 1 Day Post-Op   Subjective/Chief Complaint: She feels well this morning.  Much better than before surgery.  Tolerating liquids without issue.   Objective: Vital signs in last 24 hours: Temp:  [96.8 F (36 C)-99.3 F (37.4 C)] 98 F (36.7 C) (01/25 0518) Pulse Rate:  [55-91] 70 (01/25 0518) Resp:  [13-18] 18 (01/25 0518) BP: (95-143)/(57-89) 110/58 (01/25 0518) SpO2:  [96 %-100 %] 99 % (01/25 0518) Weight:  [73.5 kg] 73.5 kg (01/24 1428) Last BM Date : 10/15/24  Intake/Output from previous day: 01/24 0701 - 01/25 0700 In: 1985 [P.O.:180; I.V.:1655; IV Piggyback:150] Out: 490 [Urine:400; Drains:80; Blood:10] Intake/Output this shift: No intake/output data recorded.  Alert, well-appearing Unlabored respirations Abdomen is soft, nondistended, appropriately tender.  Incisions are clean, dry and intact with Dermabond, no cellulitis or hematoma.  Drain output is serosanguineous this morning.  Lab Results:  Recent Labs    10/16/24 0110 10/17/24 0453  WBC 15.7* 14.2*  HGB 13.7 11.7*  HCT 40.9 35.5*  PLT 372 257   BMET Recent Labs    10/16/24 0110 10/17/24 0453  NA 138 137  K 3.6 3.6  CL 100 105  CO2 26 24  GLUCOSE 112* 96  BUN 9 8  CREATININE 0.73 0.72  CALCIUM 9.5 8.5*   PT/INR No results for input(s): LABPROT, INR in the last 72 hours. ABG No results for input(s): PHART, HCO3 in the last 72 hours.  Invalid input(s): PCO2, PO2  Studies/Results: CT ABDOMEN PELVIS W CONTRAST Result Date: 10/16/2024 EXAM: CT ABDOMEN AND PELVIS WITH CONTRAST 10/16/2024 02:34:04 AM TECHNIQUE: CT of the abdomen and pelvis was performed with the administration of 100 mL of iohexol  (OMNIPAQUE ) 300 MG/ML solution. Multiplanar reformatted images are provided for review. Automated exposure control, iterative reconstruction, and/or weight-based adjustment of the mA/kV was utilized to reduce the radiation dose to as low as reasonably achievable. COMPARISON: None available.  CLINICAL HISTORY: Acute, nonlocalized abdominal pain. Constipation, vomiting. FINDINGS: LOWER CHEST: No acute abnormality. LIVER: Mild hepatic steatosis. Hyperemia within the adjacent hepatic parenchyma in keeping with changes of acute cholecystitis. GALLBLADDER AND BILE DUCTS: The gallbladder is distended. There is mild pericholecystic inflammatory stranding and hyperemia within the adjacent hepatic parenchyma in keeping with changes of acute cholecystitis. No biliary ductal dilatation. SPLEEN: No acute abnormality. PANCREAS: No acute abnormality. ADRENAL GLANDS: No acute abnormality. KIDNEYS, URETERS AND BLADDER: Tiny simple cortical cyst within the right kidney for which follow-up imaging is recommended. Per consensus, no follow-up is needed for simple Bosniak type 1 and 2 renal cysts, unless the patient has a malignancy history or risk factors. No stones in the kidneys or ureters. No hydronephrosis. No perinephric or periureteral stranding. Urinary bladder is unremarkable. GI AND BOWEL: The stomach, small bowel, and large bowel are otherwise unremarkable. Appendix is normal. There is no bowel obstruction. PERITONEUM AND RETROPERITONEUM: No ascites. No free air. VASCULATURE: Aorta is normal in caliber. LYMPH NODES: No lymphadenopathy. REPRODUCTIVE ORGANS: No acute abnormality. BONES AND SOFT TISSUES: No acute osseous abnormality. No focal soft tissue abnormality. IMPRESSION: 1. Acute cholecystitis with distended gallbladder, mild pericholecystic inflammatory stranding, and hyperemia within the adjacent hepatic parenchyma. 2. Mild hepatic steatosis. 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  LFTs reassuring today and drain output is serosanguineous/low volume.  Recheck tomorrow and will leave the drain in for at least a week.  Advance diet today and mobilize.  Potential discharge tomorrow if labs/drain output remain acceptable and she is feeling well.    LOS: 1 day    Mitzie DELENA Freund 10/17/2024

## 2024-10-18 ENCOUNTER — Other Ambulatory Visit (HOSPITAL_COMMUNITY): Payer: Self-pay

## 2024-10-18 LAB — CBC
HCT: 34.7 % — ABNORMAL LOW (ref 36.0–46.0)
Hemoglobin: 11.4 g/dL — ABNORMAL LOW (ref 12.0–15.0)
MCH: 29.5 pg (ref 26.0–34.0)
MCHC: 32.9 g/dL (ref 30.0–36.0)
MCV: 89.9 fL (ref 80.0–100.0)
Platelets: 269 10*3/uL (ref 150–400)
RBC: 3.86 MIL/uL — ABNORMAL LOW (ref 3.87–5.11)
RDW: 12.6 % (ref 11.5–15.5)
WBC: 13.7 10*3/uL — ABNORMAL HIGH (ref 4.0–10.5)
nRBC: 0 % (ref 0.0–0.2)

## 2024-10-18 LAB — COMPREHENSIVE METABOLIC PANEL WITH GFR
ALT: 60 U/L — ABNORMAL HIGH (ref 0–44)
AST: 34 U/L (ref 15–41)
Albumin: 3.1 g/dL — ABNORMAL LOW (ref 3.5–5.0)
Alkaline Phosphatase: 108 U/L (ref 38–126)
Anion gap: 10 (ref 5–15)
BUN: 7 mg/dL (ref 6–20)
CO2: 20 mmol/L — ABNORMAL LOW (ref 22–32)
Calcium: 8.6 mg/dL — ABNORMAL LOW (ref 8.9–10.3)
Chloride: 105 mmol/L (ref 98–111)
Creatinine, Ser: 0.62 mg/dL (ref 0.44–1.00)
GFR, Estimated: >60 mL/min
Glucose, Bld: 88 mg/dL (ref 70–99)
Potassium: 3.9 mmol/L (ref 3.5–5.1)
Sodium: 135 mmol/L (ref 135–145)
Total Bilirubin: 0.5 mg/dL (ref 0.0–1.2)
Total Protein: 6.1 g/dL — ABNORMAL LOW (ref 6.5–8.1)

## 2024-10-18 MED ORDER — OXYCODONE HCL 5 MG PO TABS
5.0000 mg | ORAL_TABLET | Freq: Four times a day (QID) | ORAL | 0 refills | Status: AC | PRN
  Filled 2024-10-18: qty 20, 5d supply, fill #0

## 2024-10-18 MED ORDER — ACETAMINOPHEN 500 MG PO TABS: 500.0000 mg | ORAL_TABLET | Freq: Four times a day (QID) | ORAL | Status: AC | PRN

## 2024-10-18 NOTE — Progress Notes (Signed)
 "  Progress Note  2 Days Post-Op  Subjective: This encounter was conducted using Spanish interpreting services.  Patient reports pain is manageable. She denies worsening pain. Tolerated regular diet this AM. Denies n/v. Had a BM and flatulence.   ROS  All negative with the exception of above.  Objective: Vital signs in last 24 hours: Temp:  [98.1 F (36.7 C)-98.4 F (36.9 C)] 98.1 F (36.7 C) (01/26 0543) Pulse Rate:  [66-83] 66 (01/26 0543) Resp:  [16-17] 17 (01/26 0543) BP: (106-121)/(60-72) 106/72 (01/26 0543) SpO2:  [94 %-100 %] 94 % (01/26 0543) Last BM Date : 10/15/24  Intake/Output from previous day: 01/25 0701 - 01/26 0700 In: 540 [P.O.:540] Out: 80 [Drains:80] Intake/Output this shift: No intake/output data recorded.  PE: General: Pleasant female who is laying in bed in NAD. HEENT: Head is normocephalic, atraumatic.  Heart: HR normal during encounter.  Lungs: Respiratory effort nonlabored on room air.  Abd: Soft, ND. Appropriate generalized tenderness to palpation. Incisions with dermabond C/D/I. Drain of right abdomen in place with SS fluid in bulb. No rebound tenderness or guarding.  MS: Able to move all 4 extremities.  Skin: Warm and dry.  Psych: A&Ox3 with an appropriate affect.    Lab Results:  Recent Labs    10/17/24 0453 10/18/24 0413  WBC 14.2* 13.7*  HGB 11.7* 11.4*  HCT 35.5* 34.7*  PLT 257 269   BMET Recent Labs    10/17/24 0453 10/18/24 0413  NA 137 135  K 3.6 3.9  CL 105 105  CO2 24 20*  GLUCOSE 96 88  BUN 8 7  CREATININE 0.72 0.62  CALCIUM 8.5* 8.6*   PT/INR No results for input(s): LABPROT, INR in the last 72 hours. CMP     Component Value Date/Time   NA 135 10/18/2024 0413   K 3.9 10/18/2024 0413   CL 105 10/18/2024 0413   CO2 20 (L) 10/18/2024 0413   GLUCOSE 88 10/18/2024 0413   BUN 7 10/18/2024 0413   CREATININE 0.62 10/18/2024 0413   CALCIUM 8.6 (L) 10/18/2024 0413   PROT 6.1 (L) 10/18/2024 0413   ALBUMIN  3.1 (L) 10/18/2024 0413   AST 34 10/18/2024 0413   ALT 60 (H) 10/18/2024 0413   ALKPHOS 108 10/18/2024 0413   BILITOT 0.5 10/18/2024 0413   GFRNONAA >60 10/18/2024 0413   Lipase     Component Value Date/Time   LIPASE 21 10/16/2024 0110       Studies/Results: No results found.  Anti-infectives: Anti-infectives (From admission, onward)    Start     Dose/Rate Route Frequency Ordered Stop   10/16/24 0315  cefTRIAXone  (ROCEPHIN ) 2 g in sodium chloride  0.9 % 100 mL IVPB        2 g 200 mL/hr over 30 Minutes Intravenous  Once 10/16/24 0302 10/16/24 0341        Assessment/Plan POD 2: S/P LAPAROSCOPIC CHOLECYSTECTOMY by Dr. Signe on 1/24 (Very difficult lap chole with aberrant anatomy) -Afebrile. -WBC trending down 13.7 from 14.2; HGB stable 11.4 from 11.7 -LFTs mostly trending down: AST 34 from 56, ALT 60 from 70, alk phos 108 from 93, Tbili 0.5 from 0.8 -Drain output total from 1/25-1/26 80 mL SS -Exam without significant concerns. -Tolerating regular diet. Denies n/v. Has had a BM and passing flautlence. -Discussed post-operative expectations and restrictions in great detail. Will provide patient with work physicist, medical. Explained that she will have a follow up later this week regarding her drain with nursing staff. Will also  arrange outpatient follow up with Dr. Signe. Patient understands she will need to call to confirm her appointments. Precautions provided. -Patient stable for discharge. Anticipate discharge this AM.   FEN: Regular VTE: Lovenox  ID: None currently   LOS: 2 days   I reviewed specialist notes, operative notes, nursing notes, last 24 h vitals and pain scores, last 48 h intake and output, last 24 h labs and trends, and last 24 h imaging results.   Angel Swanson, [facility] 10/18/2024, 10:05 AM Please see Amion for pager number during day hours 7:00am-4:30pm  "

## 2024-10-18 NOTE — Discharge Instructions (Signed)
 CIRUGIA LAPAROSCOPICA: INSTRUCCIONES DE POST OPERATORIO.  Revise siempre los documentos que le entreguen en el lugar donde se ha hecho la cirugia.  SI USTED NECESITA DOCUMENTOS DE INCAPACIDAD (DISABLE) O DE PERMISO FAMILAR (FAMILY LEAVE) NECESITA TRAERLOS A LA OFICINA PARA QUE SEAN PROCESADOS. NO  SE LOS DE A SU DOCTOR. A su alta del hospital se le dara una receta para human resources officer. Tomela como ha sido recetada, si la necesita. Si no la necesita puede tomar, Acetaminofen (Tylenol ) o Ibuprofen  (Advil ) para educational psychologist moderado. Continue tomando el resto de sus medicinas. Si necesita rellenar la receta, llame a la farmacia. ellos contactan a nuestra oficina pidiendo autorizacion. Este tipo de receta no pueden ser paccar inc de las  5pm o energy transfer partners fines de [place]. Con relacion a la dieta: debe ser el paso corporation primeros dias despues que llege a la casa. Ejemplo: sopas y galleticas. Tome bastante liquido esos dias. La dynegy de los pacientes padecen de inflamacion y cambio de coloracion de la piel alrededor de las incisiones. esto toma dias en resolver.  pnerse una bolsa de hielo en el area affectada ayuda..  Es comun tambien tener un poco de estrenimiento si esta tomado medicinas para chief technology officer. incremente la cantidad de liquidos a tomar y puede tomar (Colace) esto previene el problema. Si ya tiene estrenimiento, es designer, jewellery no ha defecado en 48 horas, puede tomar un laxativo (Milk of Magnesia or Miralax) uselo como el paquete le explica.  A menos que se le diga algo diferente. Remueva el bendaje a las 24-48 horas despues dela cirugia. y puede banarse en la ducha sin ningun problema. usted puede tener steri-strips (pequenas curitas transparentes en la piel puesta encima de la incision)  Estas banditas strips should be left on the skin for 7-10 days.   Si su cirujano puso pegamento encima de la incision usted puede banarse bajo la ducha en 24 horas. Este pegamento empezara a caerse en las  proximas 2-3 semanas. Si le pusieron suturas o presillas (grapos) estos seran quitados en su proxima cita en la oficina. . ACTIVIDADES:  Puede hacer actividad ligera.  Como caminar , subir escaleras y poco a poco irlas incrementando tanto como las [place]. Puede tener relaciones sexuales cuando sea comfortable. No carge objetos pesados o haga esfuerzos que no sean aprovados por su doctor. Puede manejar en cuanto no esta tomando medicamentos fuertes (narcoticos) para chief technology officer, pueda abrochar confortablemente el cinturon de seguridad, y pueda personnel officer y usar los pedales de su vehiculo con seguridad. PUEDE REGRESAR A TRABAJAR  Debe ver a su doctor para una cita de seguimiento en 2-3 semanas despues de la cirugia.  OTRAS ISNSTRUCCIONES:___________________________________________________________________________________ Angel Swanson A SU MEDICO: FIEBRE mayor de  101.0 No produccion de orina. Sangramiento continue de la herida Incremento de engineer, mining, enrojecimientio o drenaje de la herida (incision) Incremento de dolor abdominal.  The clinic staff is available to answer your questions during regular business hours.  Please dont hesitate to call and ask to speak to one of the nurses for clinical concerns.  If you have a medical emergency, go to the nearest emergency room or call 911.  A surgeon from [facility] is always on call at the hospital. [address], [place], [place]  [address] ? [address] [address], [place], [place]   [address] [phone] ? [phone] ? FAX [phone] Web site: [email or web address]   =================================================================================================================  Please record the amount & characteristic (color) of the fluid from your drain. Please  record the date and time when emptying your drain. You will need to bring this information to your follow up appointment for a drain check in ~ 1 week. Please  call our office if the drainage becomes very dark in color or green.

## 2024-10-18 NOTE — Progress Notes (Addendum)
 Patient discharged home, IV removed, discharge paperwork provided and explained to patient as well as patient's husband using interpreter, both patient and patient's husband verbalized understanding, primary RN educated patient as well as patient's husband on JP education using interpreter, both patient and patient's husband verbalized understanding, primary RN provided patient with material for JP drain care prior to patient leaving hospital.

## 2024-10-18 NOTE — Progress Notes (Signed)
 Discharge medications delivered to patient at the bedside in a secure bag.

## 2024-10-18 NOTE — TOC Transition Note (Signed)
 Transition of Care [facility]) - Discharge Note   Patient Details  Name: Angel Swanson MRN: [id number] Date of Birth: January 08, 1991  [facility] CM/SW Contact:  Alfonse JONELLE Rex, RN Phone Number: 10/18/2024, 11:10 AM   Clinical Narrative:   Admitted from home, presented to ED with c/o abdominal pain, underwent Lap Chole 1/24. DC to home today. No CM needs.     Final next level of care: Home/Self Care Barriers to Discharge: No Barriers Identified   Patient Goals and CMS Choice Patient states their goals for this hospitalization and ongoing recovery are:: return home          Discharge Placement                       Discharge Plan and Services Additional resources added to the After Visit Summary for                                       Social Drivers of Health (SDOH) Interventions SDOH Screenings   Food Insecurity: No Food Insecurity (10/16/2024)  Housing: Low Risk (10/16/2024)  Transportation Needs: No Transportation Needs (10/16/2024)  Utilities: Not At Risk (10/16/2024)  Tobacco Use: Low Risk (10/16/2024)     Readmission Risk Interventions    10/18/2024   11:09 AM  Readmission Risk Prevention Plan  Post Dischage Appt Complete  Medication Screening Complete  Transportation Screening Complete

## 2024-10-19 LAB — SURGICAL PATHOLOGY

## 2024-10-19 NOTE — Discharge Summary (Signed)
 [facility] Discharge Summary   Patient ID: Angel Swanson MRN: [id number] DOB/AGE: 35/05/1991 33 y.o.  Admit date: 10/16/2024 Discharge date: 10/18/2024  Admitting Diagnosis: Acute cholecystitis  Discharge Diagnosis Patient Active Problem List   Diagnosis Date Noted   Acute cholecystitis 10/16/2024   Language barrier 12/08/2017   Consultants General surgery  Imaging: CT abdomen pelvis w contrast 10/16/2024  Procedures Dr. Mitzie Freund (10/17/23) - Laparoscopic cholecystectomy with near infrared fluorescent angiography   Hospital Course:  Angel Swanson is a 34 year old female who presented to the ED with upper abdominal pain. Workup showed concerns of acute cholecystitis.  Patient was admitted and underwent procedure listed above. Blake drain was placed. Tolerated procedure well and was transferred to the floor. Diet was advanced as tolerated. On POD2, the patient was voiding well, tolerating diet, ambulating well, pain well controlled, vital signs stable, incisions c/d/i and felt stable for discharge home. Patient will follow up in our office regarding drain, and she will also follow up with Dr. Freund. She knows to call with questions or concerns. She will call to confirm appointment date/time.    Physical Exam: General: Pleasant female who is laying in bed in NAD. HEENT: Head is normocephalic, atraumatic.  Heart: HR normal during encounter.  Lungs: Respiratory effort nonlabored on room air.  Abd: Soft, ND. Appropriate generalized tenderness to palpation. Incisions with dermabond C/D/I. Drain of right abdomen in place with SS fluid in bulb. No rebound tenderness or guarding.  MS: Able to move all 4 extremities.  Skin: Warm and dry.  Psych: A&Ox3 with an appropriate affect.   I or a member of my team have reviewed this patient in the Controlled Substance Database.   Allergies as of 10/18/2024   No Known Allergies      Medication List      TAKE these medications    acetaminophen  500 MG tablet Commonly known as: TYLENOL  Take 1 tablet (500 mg total) by mouth every 6 (six) hours as needed for mild pain (pain score 1-3).   oxyCODONE  5 MG immediate release tablet Commonly known as: Oxy IR/ROXICODONE  Take 1 tablet (5 mg total) by mouth every 6 (six) hours as needed for severe pain (pain score 7-10) or moderate pain (pain score 4-6).          Follow-up Information     [facility], [place]. Call.   Specialty: General Surgery Why: Call to confirm your nurse clinic appointment for drain check, Arrive 30 mins prior to scheduled appointment time, Please bring your ID and insurance to appointment. Contact information: [address] [place]  [address] [phone]        Freund Mitzie LABOR, MD. Call.   Specialty: General Surgery Why: Please call to confirm your follow up appointment, Arrive 30 mins prior to scheduled appointment time, Please bring your ID and insurance cards with you to appointment. Contact information: [address] [place] [address] [phone]                 Signed: Marjorie Carlyon Edmundo DEVONNA [facility] 10/19/2024, 2:06 PM Please see Amion for pager number during day hours 7:00am-4:30pm
